# Patient Record
Sex: Male | Born: 1980 | Race: White | Hispanic: No | Marital: Married | Smoking: Never smoker
Health system: Southern US, Community
[De-identification: ages and names within clinical notes are randomized; demographics above are authoritative.]

## PROBLEM LIST (undated history)

## (undated) DIAGNOSIS — R569 Unspecified convulsions: Secondary | ICD-10-CM

## (undated) DIAGNOSIS — F419 Anxiety disorder, unspecified: Secondary | ICD-10-CM

## (undated) DIAGNOSIS — I10 Essential (primary) hypertension: Secondary | ICD-10-CM

## (undated) HISTORY — DX: Unspecified convulsions: R56.9

## (undated) HISTORY — PX: ROTATOR CUFF REPAIR: SHX139

## (undated) HISTORY — DX: Anxiety disorder, unspecified: F41.9

## (undated) HISTORY — DX: Essential (primary) hypertension: I10

## (undated) HISTORY — PX: OTHER SURGICAL HISTORY: SHX169

---

## 2010-02-07 HISTORY — PX: UPPER GI ENDOSCOPY: SHX6162

## 2010-02-07 HISTORY — PX: ESOPHAGOGASTRODUODENOSCOPY: SHX1529

## 2018-03-03 ENCOUNTER — Encounter: Payer: Self-pay | Admitting: Gastroenterology

## 2018-04-09 ENCOUNTER — Encounter: Payer: Self-pay | Admitting: Gastroenterology

## 2018-04-10 ENCOUNTER — Encounter: Payer: Self-pay | Admitting: Gastroenterology

## 2018-04-10 ENCOUNTER — Ambulatory Visit (INDEPENDENT_AMBULATORY_CARE_PROVIDER_SITE_OTHER): Payer: BLUE CROSS/BLUE SHIELD | Admitting: Gastroenterology

## 2018-04-10 VITALS — BP 144/84 | HR 84 | Ht 69.0 in | Wt 226.4 lb

## 2018-04-10 DIAGNOSIS — K449 Diaphragmatic hernia without obstruction or gangrene: Secondary | ICD-10-CM | POA: Diagnosis not present

## 2018-04-10 DIAGNOSIS — R1011 Right upper quadrant pain: Secondary | ICD-10-CM

## 2018-04-10 DIAGNOSIS — K219 Gastro-esophageal reflux disease without esophagitis: Secondary | ICD-10-CM | POA: Diagnosis not present

## 2018-04-10 MED ORDER — DEXLANSOPRAZOLE 60 MG PO CPDR: 60.0000 mg | DELAYED_RELEASE_CAPSULE | Freq: Every day | ORAL | 0 refills | Status: DC

## 2018-04-10 NOTE — Progress Notes (Signed)
Chief Complaint: Abdominal pain, reflux small internal nonbleeding hemorrhoids  Referring Provider:  Dr Sherral Hammers     ASSESSMENT AND PLAN;   #1. RUQ Pain - neg EGD with SB bx 2011, neg [place] 08/2011 #2. GERD with small HH (EGD 01/2010, neg SB bx). #3  IBS with constipation.  Episode of rectal bleeding attributed to hemorrhoids.  No further bleeding.  Plan: - Switch Protonix to Dexilant 60 mg p.o. once a day (2 weeks samples were given)- he did well with Dexilant in the past. - Start Colace 1 tab po qd. - Proceed with ultrasound of the abdomen complete.  If still with RUQ Pain, and work-up is negative, proceed with HIDA with EF to rule out biliary dyskinesia. - If not better in 2 weeks, proceed with EGD and check CBC, CMP, lipase. Pt and patient's wife will call and let us know. - Increase water intake. - Use Preparation H 1 twice daily after the bowel movement for 7 to 10 days.  Patient is to report if he has any further rectal bleeding.  We would like to hold off on colonoscopy at the present time since I do believe that the yield will be low.  However, if patient has any recurrent symptoms, then we would go ahead with colonoscopy.  Patient and patient's wife agreed to hold off on endoscopic procedures at the present time.  - Follow-up in 12 weeks, earlier in case of any problems.    HPI:   37 year old  c/O RUQ pain 1-2 weeks. Sharp, intermittent, occasionally after fatty foods, no nocturnal symptoms, lasting for few minutes to hours.  No radiation. With nausea, no vomiting. Occ exacerbated with eating.  No dysphagia, melena or weight loss. Has been having reflux and acid x yrs, worse over 1 month ago. Had EGD as above which showed small hiatal hernia.  No Barrett's esophagus. Seen by Dr. Sherral Hammers a month ago, he was started on Protonix 40 mg p.o. once a day with some relief. He continues to have problems more at night.  He has raised the head of the bed. No sodas, chocolates, chewing  gums and candy. No ETOH. No NSAIds. Has h/o constipation with occ bloating, passage of pellet-like stools with rare diarrhea. 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  Has not occurred since.   Past Medical History:  Diagnosis Date  . Anxiety   . Hypertension   . Seizures (HCC)     Past Surgical History:  Procedure Laterality Date  . shoulder surgery Left   . UPPER GI ENDOSCOPY  02/07/2010   SMall transient hiatal hernia. Mild gastritis. Bx: Negative for Celiac Disease, negative Clotest    Family History  Problem Relation Age of Onset  . Colon polyps Father   . Diabetes Maternal Grandmother   . Diabetes Maternal Grandfather   . Diabetes Paternal Grandmother   . Diabetes Paternal Grandfather   . Colon polyps Paternal Uncle     Social History   Tobacco Use  . Smoking status: Never Smoker  . Smokeless tobacco: Never Used  Substance Use Topics  . Alcohol use: Not Currently  . Drug use: Never    Current Outpatient Medications  Medication Sig Dispense Refill  . divalproex (DEPAKOTE) 250 MG DR tablet Take 250 mg by mouth daily.    Marland Kitchen lisinopril (PRINIVIL,ZESTRIL) 20 MG tablet Take 20 mg by mouth daily.    . pantoprazole (PROTONIX) 40 MG tablet Take 40 mg by mouth daily.  No current facility-administered medications for this visit.     Not on File  Review of Systems:  Constitutional: Denies fever, chills, diaphoresis, appetite change and fatigue.  HEENT: Denies photophobia, eye pain, redness, hearing loss, ear pain, congestion, sore throat, rhinorrhea, sneezing, mouth sores, neck pain, neck stiffness and tinnitus.   Respiratory: Denies SOB, DOE, cough, chest tightness,  and wheezing.   Cardiovascular: Denies chest pain, palpitations and leg swelling.  Genitourinary: Denies dysuria, urgency, frequency, hematuria, flank pain and difficulty urinating.  Musculoskeletal: Denies myalgias, back pain, joint swelling, arthralgias and gait problem.   Skin: No rash.  Neurological: Denies dizziness,  Had seizures, no syncope, weakness, light-headedness, numbness and headaches.  Hematological: Denies adenopathy. Easy bruising, personal or family bleeding history  Psychiatric/Behavioral: No anxiety or depression     Physical Exam:    BP (!) 144/84   Pulse 84   Ht  (1.753 m)   Wt 226 lb 6 oz (102.7 kg)   BMI 33.43 kg/m  Filed Weights   04/10/18 0837  Weight: 226 lb 6 oz (102.7 kg)   Constitutional:  Well-developed, in no acute distress. Psychiatric: Normal mood and affect. Behavior is normal. HEENT: Pupils normal.  Conjunctivae are normal. No scleral icterus. Neck supple.  Cardiovascular: Normal rate, regular rhythm. No edema Pulmonary/chest: Effort normal and breath sounds normal. No wheezing, rales or rhonchi. Abdominal: Soft, nondistended. Nontender. Bowel sounds active throughout. There are no masses palpable. No hepatomegaly. Rectal: Small, nonbleeding internal hemorrhoids, heme-negative stools, performed in presence of [facility] Neurological: Alert and oriented to person place and time. Skin: Skin is warm and dry. No rashes noted.   Edman Circle, MD   Cc: Dr. Sherral Hammers

## 2018-04-10 NOTE — Patient Instructions (Signed)
If you are age 38 or older, your body mass index should be between 23-30. Your Body mass index is 33.43 kg/m. If this is out of the aforementioned range listed, please consider follow up with your Primary Care Provider.  If you are age 10 or younger, your body mass index should be between 19-25. Your Body mass index is 33.43 kg/m. If this is out of the aformentioned range listed, please consider follow up with your Primary Care Provider.   Please purchase the following medications over the counter and take as directed: Colace one by mouth daily.  We have given you samples of the following medication to take: Dexilant 60 mg once daily  You have been scheduled for an abdominal ultrasound at [facility] (1st floor [address] ) on 04/13/18 at 8am . Please arrive 15 minutes prior to your appointment for registration. Make certain not to have anything to eat or drink 6 hours prior to your appointment. Should you need to reschedule your appointment, please contact radiology at [phone]. This test typically takes about 30 minutes to perform.  Please call Dr. Donne Hazel nurse Christen Bame, RN)  in 2 weeks at [phone]  to let her now how you are doing.    Thank you,  Dr. Lynann Bologna

## 2018-04-13 ENCOUNTER — Telehealth: Payer: Self-pay | Admitting: Gastroenterology

## 2018-04-13 ENCOUNTER — Ambulatory Visit (HOSPITAL_BASED_OUTPATIENT_CLINIC_OR_DEPARTMENT_OTHER)
Admission: RE | Admit: 2018-04-13 | Discharge: 2018-04-13 | Disposition: A | Discharge status: Home / Self Care | Payer: BLUE CROSS/BLUE SHIELD | Source: Ambulatory Visit | Attending: Gastroenterology | Admitting: Gastroenterology

## 2018-04-13 ENCOUNTER — Telehealth: Payer: Self-pay

## 2018-04-13 DIAGNOSIS — R1011 Right upper quadrant pain: Secondary | ICD-10-CM

## 2018-04-13 DIAGNOSIS — K449 Diaphragmatic hernia without obstruction or gangrene: Secondary | ICD-10-CM | POA: Diagnosis not present

## 2018-04-13 DIAGNOSIS — K219 Gastro-esophageal reflux disease without esophagitis: Secondary | ICD-10-CM | POA: Diagnosis not present

## 2018-04-13 DIAGNOSIS — K824 Cholesterolosis of gallbladder: Secondary | ICD-10-CM | POA: Insufficient documentation

## 2018-04-13 NOTE — Telephone Encounter (Signed)
I did send a copy of the ultrasound to his PCP.

## 2018-04-13 NOTE — Telephone Encounter (Signed)
-----   Message from Lynann Bologna, MD sent at 04/13/2018 10:42 AM EDT ----- Neg [place] Mild fatty liver, small gallbladder polyps Please inform patient about the results. Please see comment/plan as per last clinic note. Please send a copy to family physician.

## 2018-04-13 NOTE — Telephone Encounter (Signed)
Notified of test result by phone.

## 2018-04-13 NOTE — Telephone Encounter (Signed)
He called back to clarify the polyp was in his gallbladder, it is.

## 2018-04-23 ENCOUNTER — Telehealth: Payer: Self-pay | Admitting: Gastroenterology

## 2018-04-23 NOTE — Telephone Encounter (Signed)
Returned his call, voicemail.  Lmtcb

## 2018-04-24 ENCOUNTER — Telehealth: Payer: Self-pay

## 2018-04-24 ENCOUNTER — Other Ambulatory Visit: Payer: Self-pay

## 2018-04-24 DIAGNOSIS — K219 Gastro-esophageal reflux disease without esophagitis: Secondary | ICD-10-CM

## 2018-04-24 MED ORDER — DEXLANSOPRAZOLE 60 MG PO CPDR: 60.0000 mg | DELAYED_RELEASE_CAPSULE | Freq: Every day | ORAL | 1 refills | Status: DC

## 2018-04-24 NOTE — Telephone Encounter (Signed)
He called back to report that his symptoms are much improved with the Dexilant and asked for a full prescription.  I sent #90 with 1 refill to his pharmacy.

## 2018-04-24 NOTE — Telephone Encounter (Signed)
Patient states [facility] has not yet received prescription and is asking we call them again.

## 2018-04-24 NOTE — Telephone Encounter (Signed)
I did physically call the prescription to the pharmacy and the patient is aware.

## 2018-04-27 NOTE — Telephone Encounter (Signed)
Please see if he has received a prescription

## 2018-04-28 ENCOUNTER — Telehealth: Payer: Self-pay

## 2018-04-28 NOTE — Telephone Encounter (Signed)
I called the rx to the pharmacy on Friday, patient is aware.

## 2018-04-30 ENCOUNTER — Encounter: Payer: Self-pay | Admitting: Gastroenterology

## 2018-05-01 ENCOUNTER — Encounter: Payer: Self-pay | Admitting: Gastroenterology

## 2018-05-01 ENCOUNTER — Ambulatory Visit (INDEPENDENT_AMBULATORY_CARE_PROVIDER_SITE_OTHER): Payer: BLUE CROSS/BLUE SHIELD | Admitting: Gastroenterology

## 2018-05-01 VITALS — BP 120/76 | HR 78 | Ht 69.0 in | Wt 227.1 lb

## 2018-05-01 DIAGNOSIS — R1011 Right upper quadrant pain: Secondary | ICD-10-CM

## 2018-05-01 NOTE — Progress Notes (Signed)
IMPRESSION and PLAN:   #1. RUQ Pain (resolved) - neg [place] except for small GB polyp, neg EGD with SB bx 2011, neg [place] 08/2011 #2. GERD with small HH (EGD 01/2010, neg SB bx). #3  IBS with constipation.  No further bleeding.  Plan: - Continue Dexilant 60 mg p.o. once a day (2 weeks samples were given)- he did well with Dexilant in the past. - Proceed with HIDA with EF to rule out biliary dyskinesia. - If not better in 2 weeks, proceed with EGD and check CBC, CMP, lipase. Pt and patient's wife will call and let us know. - Increase water intake. - Encouraged to lose weight (6lb over 3 months) - Follow-up in case of any problems.      HPI:    Chief Complaint:   Mike Miranda is a 37 y.o. male  Doing very well on Dexilant samples -having problems getting insurance approve Dexilant. Ultrasound was negative for any gallstones.  It did show small gallbladder polyp and fatty liver. Minimal right sided abdominal pain. No nausea, vomiting, heartburn, regurgitation, odynophagia or dysphagia.  No further rectal bleeding.  He would like to hold off on any endoscopic procedures but willing to get a HIDA scan.   Past Medical History:  Diagnosis Date  . Anxiety   . Hypertension   . Seizures (HCC)     Current Outpatient Medications  Medication Sig Dispense Refill  . dexlansoprazole (DEXILANT) 60 MG capsule Take 1 capsule (60 mg total) by mouth daily. 90 capsule 1  . divalproex (DEPAKOTE) 250 MG DR tablet Take 250 mg by mouth daily.    Marland Kitchen lisinopril (PRINIVIL,ZESTRIL) 20 MG tablet Take 20 mg by mouth daily.    . pantoprazole (PROTONIX) 40 MG tablet Take 40 mg by mouth daily.     No current facility-administered medications for this visit.     Past Surgical History:  Procedure Laterality Date  . ESOPHAGOGASTRODUODENOSCOPY  02/07/2010   Small transient hiatal hernia  . shoulder surgery Left   . UPPER GI ENDOSCOPY  02/07/2010   SMall transient hiatal hernia. Mild gastritis. Bx: Negative  for Celiac Disease, negative Clotest    Family History  Problem Relation Age of Onset  . Colon polyps Father   . Diabetes Maternal Grandmother   . Diabetes Maternal Grandfather   . Diabetes Paternal Grandmother   . Diabetes Paternal Grandfather   . Colon polyps Paternal Uncle     Social History   Tobacco Use  . Smoking status: Never Smoker  . Smokeless tobacco: Never Used  Substance Use Topics  . Alcohol use: Not Currently  . Drug use: Never    Not on File   Review of Systems: All systems reviewed and negative except where noted in HPI.    Physical Exam:     BP 120/76   Pulse 78   Ht  (1.753 m)   Wt 227 lb 2 oz (103 kg)   BMI 33.54 kg/m  @ GENERAL:  Alert, oriented, cooperative, not in acute distress. PSYCH: :Pleasant, normal mood and affect. ABDOMEN: Inspection: No visible peristalsis, no abnormal pulsations, skin normal.  Palpation/percussion: Soft, nontender, nondistended, no rigidity, no abnormal dullness to percussion, no palpable abdominal masses.  Auscultation: Normal bowel sounds, no abdominal bruits. NEURO: Alert and oriented x 3,   Radiology Studies: US Abdomen Complete  Result Date: 04/13/2018 CLINICAL DATA:  Right upper quadrant abdominal pain. EXAM: ABDOMEN ULTRASOUND COMPLETE COMPARISON:  None. FINDINGS: Gallbladder: No gallstones or  wall thickening visualized. No sonographic Murphy sign noted by sonographer. 2 by 3 mm polyp in the gallbladder on image 61. Common bile duct: Diameter: 4 mm Liver: No focal lesion identified. Mildly accentuated echogenicity could reflect mild hepatic steatosis. Portal vein is patent on color Doppler imaging with normal direction of blood flow towards the liver. IVC: No abnormality visualized. Pancreas: Visualized portion unremarkable. Pancreatic tail and portions of the pancreatic head not well seen due to overlying bowel gas. Spleen: Size and appearance within normal limits. Right Kidney: Length: 11.9 cm.  Echogenicity within normal limits. No mass or hydronephrosis visualized. Left Kidney: Length: 11.9 cm. Echogenicity within normal limits. No mass or hydronephrosis visualized. Abdominal aorta: No aneurysm visualized. Other findings: None. IMPRESSION: 1. 2 by 3 mm gallbladder polyp. Gallbladder polyps in this size range are considered benign and do not require follow up. 2. Suspected mild hepatic steatosis. 3. A cause for the patient's right upper quadrant abdominal pain is not identified. Electronically Signed   By: Gaylyn Rong M.D.   On: 04/13/2018 08:57      Calel Pisarski,MD 05/01/2018, 8:44 AM   CC Hadley Pen, MD

## 2018-05-01 NOTE — Patient Instructions (Signed)
If you are age 39 or older, your body mass index should be between 23-30. Your Body mass index is 33.54 kg/m. If this is out of the aforementioned range listed, please consider follow up with your Primary Care Provider.  If you are age 21 or younger, your body mass index should be between 19-25. Your Body mass index is 33.54 kg/m. If this is out of the aformentioned range listed, please consider follow up with your Primary Care Provider.    We have given you samples of the following medication to take: Dexilant 60 mg take one capsule daily x 4 weeks and then as needed.   You have been scheduled for a HIDA scan at [facility] (1st floor) on 05/08/18. Please arrive 15 minutes prior to your scheduled appointment at 1:61WR. Make certain not to have anything to eat or drink at least 6 hours prior to your test. Should this appointment date or time not work well for you, please call radiology scheduling at [phone].  _____________________________________________________________________ hepatobiliary (HIDA) scan is an imaging procedure used to diagnose problems in the liver, gallbladder and bile ducts. In the HIDA scan, a radioactive chemical or tracer is injected into a vein in your arm. The tracer is handled by the liver like bile. Bile is a fluid produced and excreted by your liver that helps your digestive system break down fats in the foods you eat. Bile is stored in your gallbladder and the gallbladder releases the bile when you eat a meal. A special nuclear medicine scanner (gamma camera) tracks the flow of the tracer from your liver into your gallbladder and small intestine.  During your HIDA scan  You'll be asked to change into a hospital gown before your HIDA scan begins. Your health care team will position you on a table, usually on your back. The radioactive tracer is then injected into a vein in your arm.The tracer travels through your bloodstream to your liver, where it's taken up by  the bile-producing cells. The radioactive tracer travels with the bile from your liver into your gallbladder and through your bile ducts to your small intestine.You may feel some pressure while the radioactive tracer is injected into your vein. As you lie on the table, a special gamma camera is positioned over your abdomen taking pictures of the tracer as it moves through your body. The gamma camera takes pictures continually for about an hour. You'll need to keep still during the HIDA scan. This can become uncomfortable, but you may find that you can lessen the discomfort by taking deep breaths and thinking about other things. Tell your health care team if you're uncomfortable. The radiologist will watch on a computer the progress of the radioactive tracer through your body. The HIDA scan may be stopped when the radioactive tracer is seen in the gallbladder and enters your small intestine. This typically takes about an hour. In some cases extra imaging will be performed if original images aren't satisfactory, if morphine is given to help visualize the gallbladder or if the medication CCK is given to look at the contraction of the gallbladder. This test typically takes 2 hours to complete. ________________________________________________________________________   Thank you,  Dr. Lynann Bologna

## 2018-05-08 ENCOUNTER — Ambulatory Visit (HOSPITAL_COMMUNITY)
Admission: RE | Admit: 2018-05-08 | Discharge: 2018-05-08 | Disposition: A | Discharge status: Home / Self Care | Payer: BLUE CROSS/BLUE SHIELD | Source: Ambulatory Visit | Attending: Gastroenterology | Admitting: Gastroenterology

## 2018-05-08 DIAGNOSIS — R1011 Right upper quadrant pain: Secondary | ICD-10-CM

## 2018-05-08 MED ORDER — TECHNETIUM TC 99M MEBROFENIN IV KIT
5.0000 | PACK | Freq: Once | INTRAVENOUS | Status: AC | PRN
  Administered 2018-05-08: 5 via INTRAVENOUS

## 2018-05-12 ENCOUNTER — Telehealth: Payer: Self-pay | Admitting: Gastroenterology

## 2018-05-12 NOTE — Telephone Encounter (Signed)
I spoke with the patient and gave him the results of the HIDA scan.  He reports that the Dexilant is working but has noticed that there are definitely some foods that trigger his pain.  Suggested he keep a food diary.  He should try to avoid those foods.  He will report back in a week or so to let us know how he is doing.

## 2018-05-12 NOTE — Telephone Encounter (Signed)
Patient states he is returning nurses call about test results from 6.7.19.

## 2018-11-06 ENCOUNTER — Other Ambulatory Visit: Payer: Self-pay | Admitting: Gastroenterology

## 2018-11-06 DIAGNOSIS — K219 Gastro-esophageal reflux disease without esophagitis: Secondary | ICD-10-CM

## 2019-06-29 IMAGING — US US ABDOMEN COMPLETE
1 series · 13 of 25 positions shown · non-contrast
Comparison: None.

CLINICAL DATA: Right upper quadrant abdominal pain.

EXAM:
ABDOMEN ULTRASOUND COMPLETE

[Series 1: us abdomen complete · 0.28mm/px · 13 of 106 slices shown]
[im 1/106]
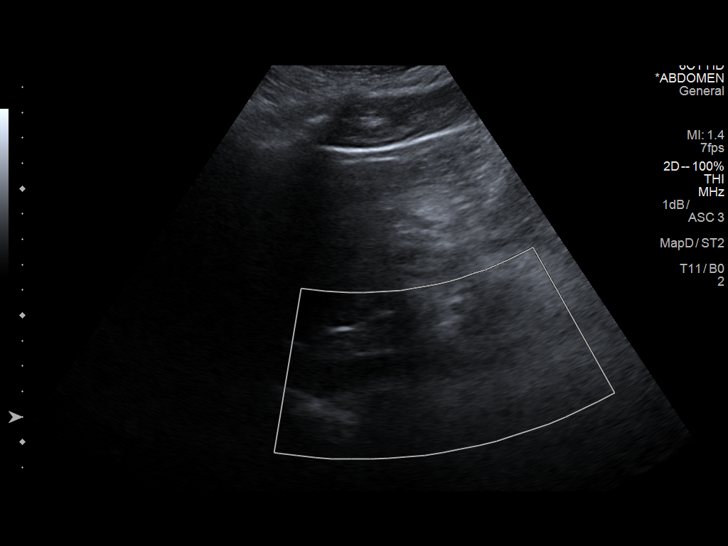
[im 9/106]
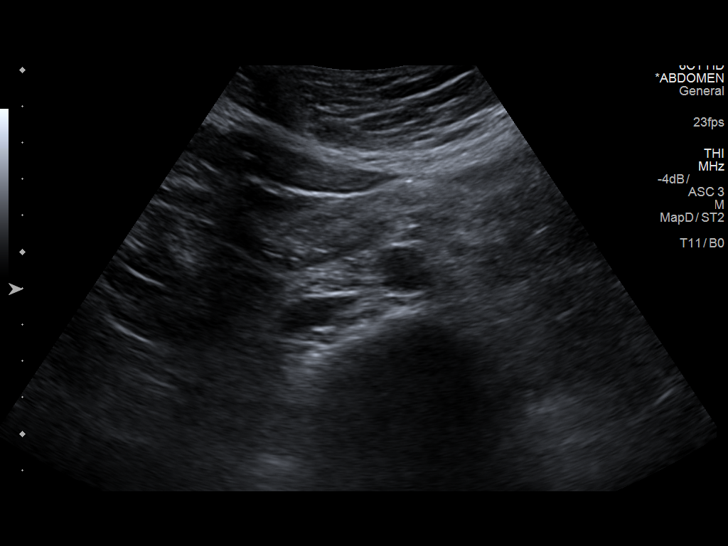
[im 18/106]
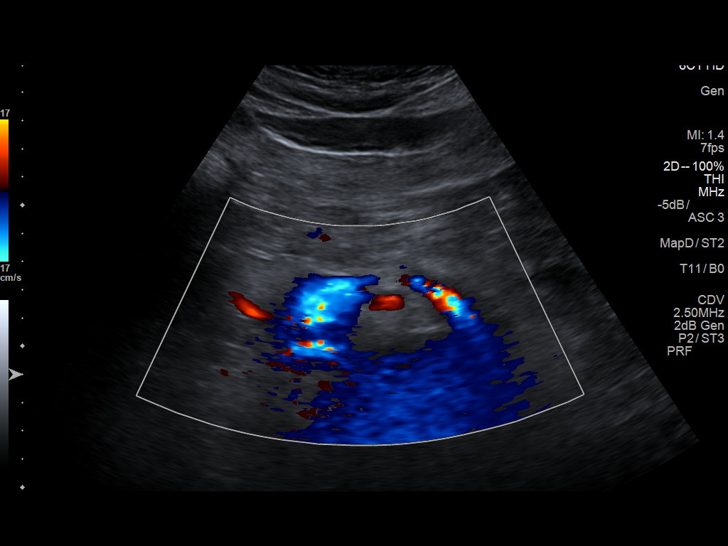
[im 27/106]
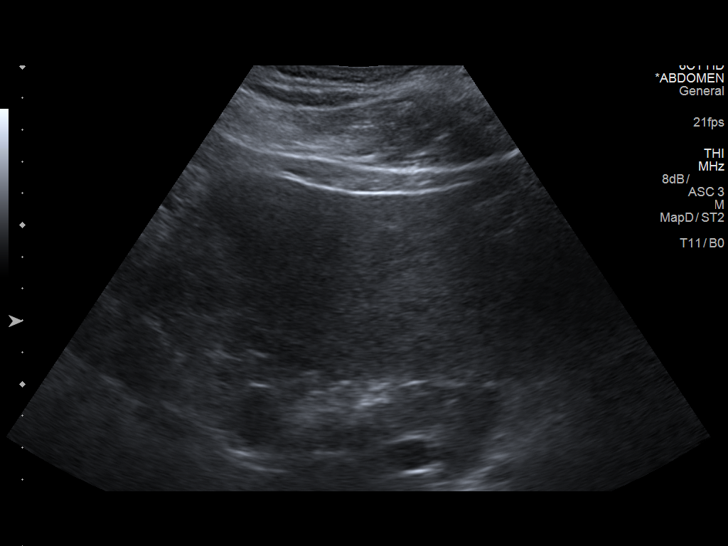
[im 36/106]
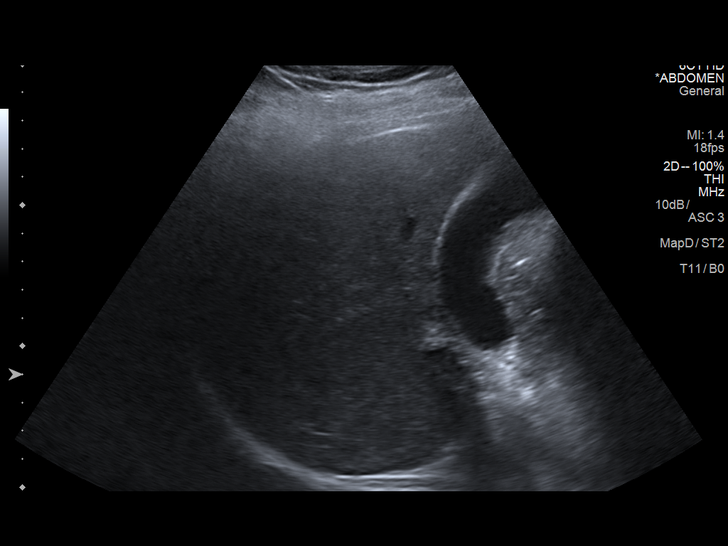
[im 44/106]
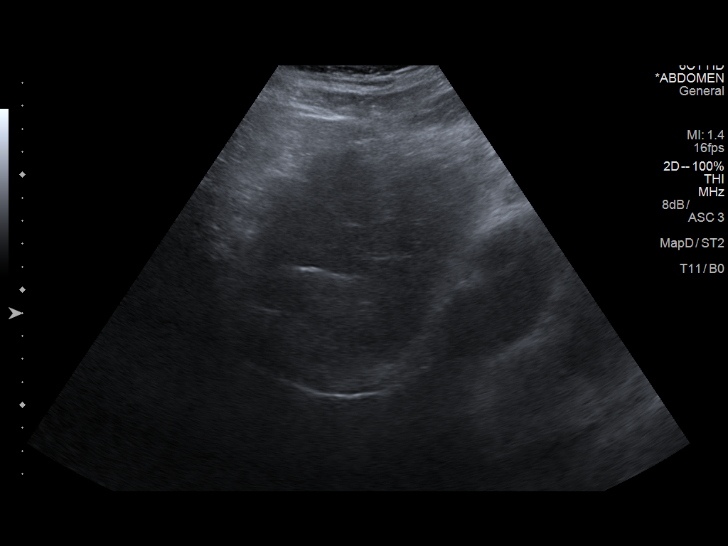
[im 53/106]
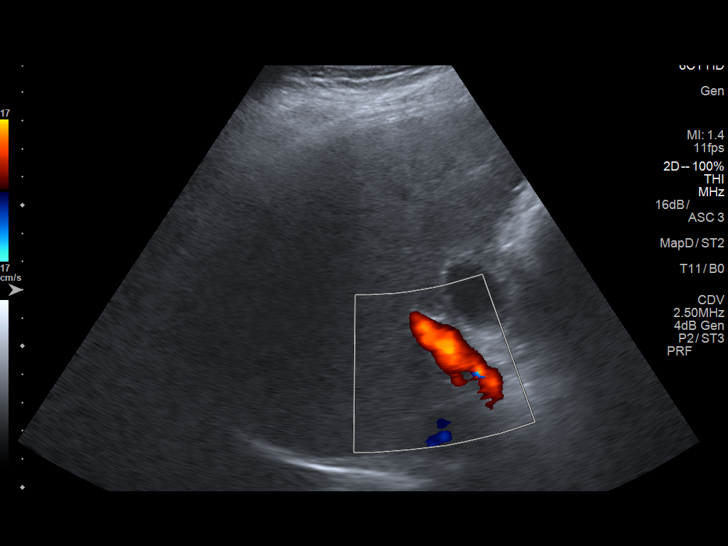
[im 62/106]
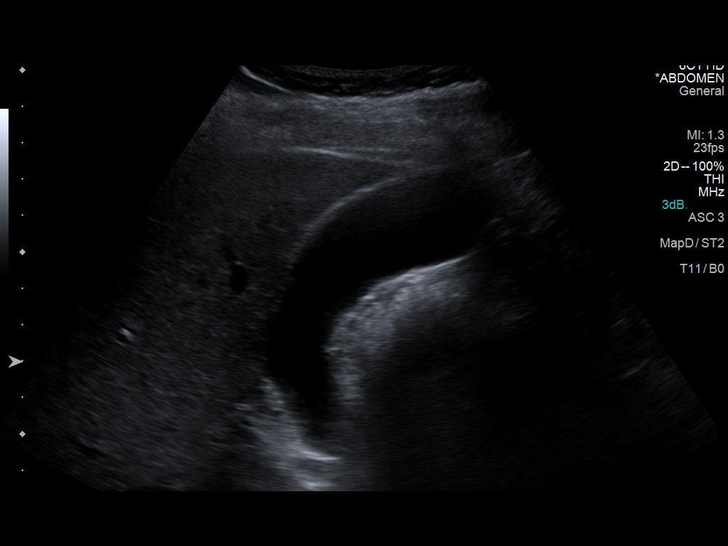
[im 71/106]
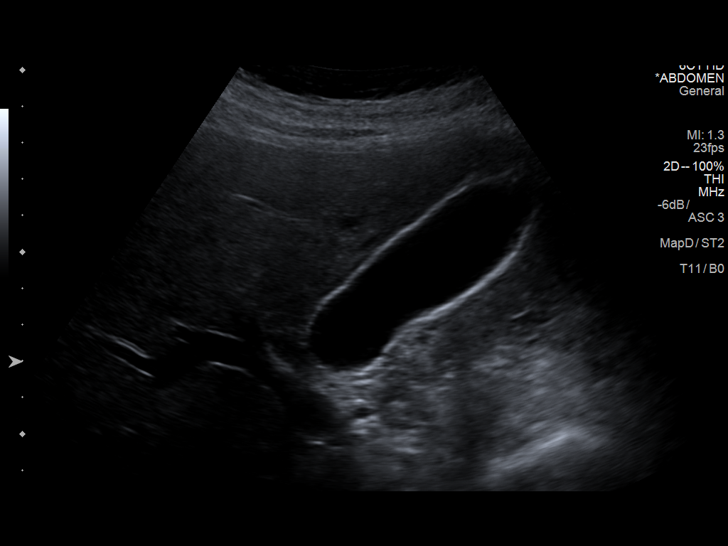
[im 79/106]
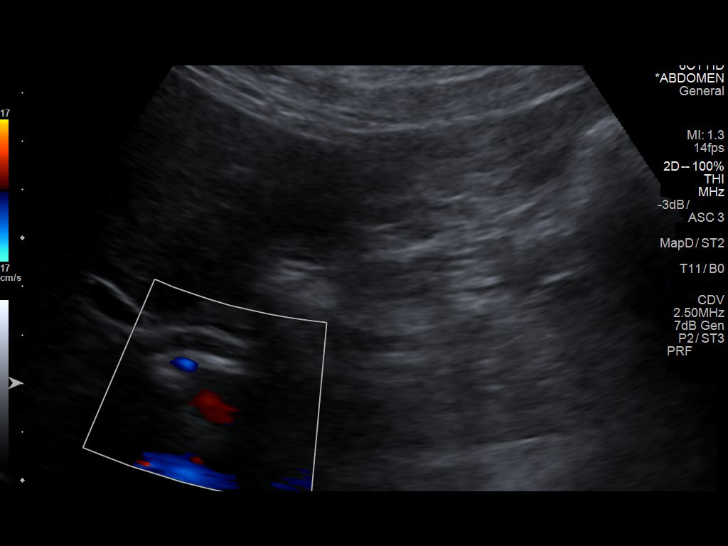
[im 88/106]
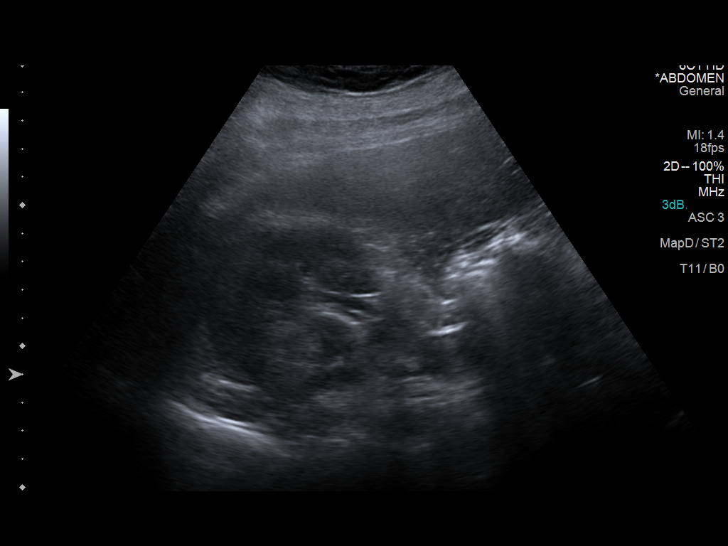
[im 97/106]
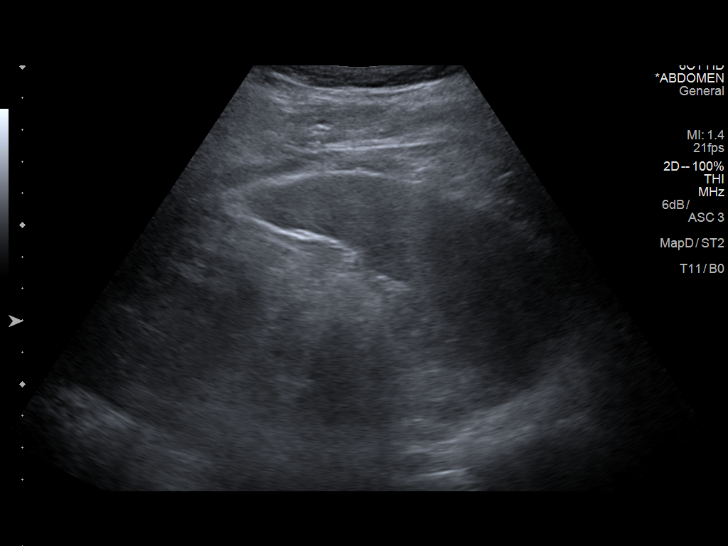
[im 106/106]
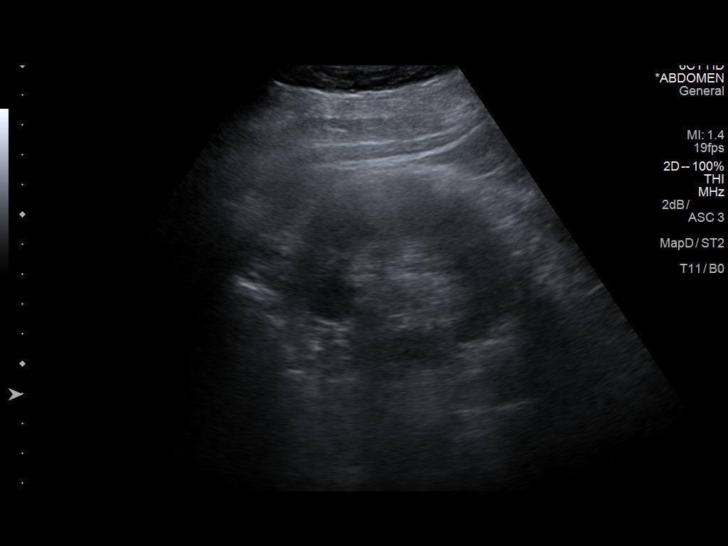

[13 of 25 positions shown; findings below may reference images not displayed]

FINDINGS: Gallbladder: No gallstones or wall thickening visualized. No
sonographic Murphy sign noted by sonographer. 2 by 3 mm polyp in the
gallbladder on image 61.

Common bile duct: Diameter: 4 mm

Liver: No focal lesion identified. Mildly accentuated echogenicity
could reflect mild hepatic steatosis. Portal vein is patent on color
Doppler imaging with normal direction of blood flow towards the
liver.

IVC: No abnormality visualized.

Pancreas: Visualized portion unremarkable. Pancreatic tail and
portions of the pancreatic head not well seen due to overlying bowel
gas.

Spleen: Size and appearance within normal limits.

Right Kidney: Length: 11.9 cm. Echogenicity within normal limits. No
mass or hydronephrosis visualized.

Left Kidney: Length: 11.9 cm. Echogenicity within normal limits. No
mass or hydronephrosis visualized.

Abdominal aorta: No aneurysm visualized.

Other findings: None.
IMPRESSION: 1. 2 by 3 mm gallbladder polyp. Gallbladder polyps in this size
range are considered benign and do not require follow up.
2. Suspected mild hepatic steatosis.
3. A cause for the patient's right upper quadrant abdominal pain is
not identified.

## 2019-08-03 ENCOUNTER — Encounter: Payer: Self-pay | Admitting: Gastroenterology

## 2019-08-03 ENCOUNTER — Other Ambulatory Visit: Payer: Self-pay

## 2019-08-03 ENCOUNTER — Ambulatory Visit (INDEPENDENT_AMBULATORY_CARE_PROVIDER_SITE_OTHER): Payer: BC Managed Care – PPO | Admitting: Gastroenterology

## 2019-08-03 VITALS — BP 144/94 | HR 93 | Temp 98.3°F | Ht 69.0 in | Wt 235.4 lb

## 2019-08-03 DIAGNOSIS — R1011 Right upper quadrant pain: Secondary | ICD-10-CM

## 2019-08-03 DIAGNOSIS — K219 Gastro-esophageal reflux disease without esophagitis: Secondary | ICD-10-CM | POA: Diagnosis not present

## 2019-08-03 DIAGNOSIS — K581 Irritable bowel syndrome with constipation: Secondary | ICD-10-CM

## 2019-08-03 MED ORDER — DICYCLOMINE HCL 10 MG PO CAPS: ORAL_CAPSULE | ORAL | 3 refills | Status: DC

## 2019-08-03 MED ORDER — DEXILANT 60 MG PO CPDR: 1.0000 | DELAYED_RELEASE_CAPSULE | Freq: Every day | ORAL | 11 refills | Status: AC

## 2019-08-03 NOTE — Patient Instructions (Signed)
If you are age 39 or older, your body mass index should be between 23-30. Your Body mass index is 34.76 kg/m. If this is out of the aforementioned range listed, please consider follow up with your Primary Care Provider.  If you are age 29 or younger, your body mass index should be between 19-25. Your Body mass index is 34.76 kg/m. If this is out of the aformentioned range listed, please consider follow up with your Primary Care Provider.   We have sent the following medications to your pharmacy for you to pick up at your convenience: [place] at bedtime.   Increase water intake.   Try to lose 6 lbs over the next 3 months.   You have been scheduled for an endoscopy. Please follow written instructions given to you at your visit today. If you use inhalers (even only as needed), please bring them with you on the day of your procedure. Your physician has requested that you go to www.startemmi.com and enter the access code given to you at your visit today. This web site gives a general overview about your procedure. However, you should still follow specific instructions given to you by our office regarding your preparation for the procedure.   Thank you,  Dr. Jackquline Denmark

## 2019-08-03 NOTE — Progress Notes (Signed)
IMPRESSION and PLAN:   #1. RUQ Pain (resolved) - neg US except for unchanged small GB polyp 08/2011, 04/2018, neg EGD with SB bx 2011, neg HIDA with EF 05/2018. #2. GERD with small HH (EGD 01/2010, neg SB bx). Failed omeprazole. #3  IBS with constipation, now with occ diarrhea.  No further bleeding.  Plan: - Continue Dexilant 60 mg p.o. once a day #30, 11 refills. Dexilant coupons please - Continue pepcid 20mg  po qhs. - Proceed with EGD.  Discussed risks and benefits. - Increase water intake. - Bentyl 10mg  po bid 1/2hr before lunch and bed time (#60). 11 refills - Encouraged to lose weight (6lb over 3 months) - Follow-up in case of any problems. If still with problems, will perform further work-up.  At the present time would like to hold off on colonoscopy since I do believe the yield will be low.  He agrees.      HPI:    Chief Complaint:   Marguerite OleaJason Miranda is a 38 y.o. male  FU visit Having upper abdo pain, mainly right upper quadrant With heartburn No dysphagia But has regurgitation Doing very well on Dexilant samples -having problems getting insurance approve Dexilant. Ultrasound was negative for any gallstones.  It did show small gallbladder polyp and fatty liver. HIDA with EF was neg. Minimal right sided abdominal pain. Has occ diarrhea now after eating out.  More so recently.  No nocturnal symptoms. No nausea, vomiting, heartburn, regurgitation, odynophagia or dysphagia.  No further rectal bleeding.  Gained 6 pounds over the last 3 months.  Review of labs: 01/29/2019 normal CMP  Past Medical History:  Diagnosis Date  . Anxiety   . Hypertension   . Seizures (HCC)     Current Outpatient Medications  Medication Sig Dispense Refill  . DEXILANT 60 MG capsule TAKE ONE CAPSULE BY MOUTH DAILY 90 capsule 1  . divalproex (DEPAKOTE) 250 MG DR tablet Take 250 mg by mouth daily.    . famotidine (PEPCID) 40 MG tablet Take 40 mg by mouth at bedtime.    Marland Kitchen. lisinopril  (PRINIVIL,ZESTRIL) 20 MG tablet Take 20 mg by mouth daily.     No current facility-administered medications for this visit.     Past Surgical History:  Procedure Laterality Date  . ESOPHAGOGASTRODUODENOSCOPY  02/07/2010   Small transient hiatal hernia  . shoulder surgery Left   . UPPER GI ENDOSCOPY  02/07/2010   SMall transient hiatal hernia. Mild gastritis. Bx: Negative for Celiac Disease, negative Clotest    Family History  Problem Relation Age of Onset  . Colon polyps Father   . Diabetes Maternal Grandmother   . Diabetes Maternal Grandfather   . Diabetes Paternal Grandmother   . Diabetes Paternal Grandfather   . Colon polyps Paternal Uncle   . Colon cancer Neg Hx   . Esophageal cancer Neg Hx     Social History   Tobacco Use  . Smoking status: Never Smoker  . Smokeless tobacco: Never Used  Substance Use Topics  . Alcohol use: Not Currently  . Drug use: Never    Not on File   Review of Systems: All systems reviewed and negative except where noted in HPI.    Physical Exam:     BP (!) 144/94   Pulse 93   Temp 98.3 F (36.8 C)   Ht 5\' 9"  (1.753 m)   Wt 235 lb 6 oz (106.8 kg)   BMI 34.76 kg/m  @WEIGHTLAST3 @ GENERAL:  Alert, oriented,  cooperative, not in acute distress. PSYCH: :Pleasant, normal mood and affect. ABDOMEN: Inspection: No visible peristalsis, no abnormal pulsations, skin normal.  Palpation/percussion: Soft, nontender, nondistended, no rigidity, no abnormal dullness to percussion, no palpable abdominal masses.  Auscultation: Normal bowel sounds, no abdominal bruits. NEURO: Alert and oriented x 3,   Radiology Studies: No results found.    Mike Brodrick,MD 08/03/2019, 2:19 PM   CC Myrlene Broker, MD

## 2019-08-16 ENCOUNTER — Telehealth: Payer: Self-pay | Admitting: Gastroenterology

## 2019-08-16 NOTE — Telephone Encounter (Signed)
Do you now or have you had a fever in the last 14 days?     ° °  ° °Do you have any respiratory symptoms of shortness of breath or cough now or in the last 14 days?    ° °  ° °Do you have any family members or close contacts with diagnosed or suspected Covid-19 in the past 14 days?     ° °  ° °Have you been tested for Covid-19 and found to be positive?    ° °  ° °Pt made aware to check in on theth floor and that care partner may wait in the car or come up to the lobby during the procedure but will need to provide their own mask. °

## 2019-08-17 ENCOUNTER — Ambulatory Visit (AMBULATORY_SURGERY_CENTER): Payer: BC Managed Care – PPO | Admitting: Gastroenterology

## 2019-08-17 ENCOUNTER — Other Ambulatory Visit: Payer: Self-pay

## 2019-08-17 ENCOUNTER — Encounter: Payer: Self-pay | Admitting: Gastroenterology

## 2019-08-17 VITALS — BP 115/74 | HR 72 | Temp 97.6°F | Resp 13 | Ht 69.0 in | Wt 235.0 lb

## 2019-08-17 DIAGNOSIS — K219 Gastro-esophageal reflux disease without esophagitis: Secondary | ICD-10-CM | POA: Diagnosis present

## 2019-08-17 DIAGNOSIS — K2970 Gastritis, unspecified, without bleeding: Secondary | ICD-10-CM

## 2019-08-17 DIAGNOSIS — K449 Diaphragmatic hernia without obstruction or gangrene: Secondary | ICD-10-CM

## 2019-08-17 MED ORDER — SODIUM CHLORIDE 0.9 % IV SOLN: 500.0000 mL | Freq: Once | INTRAVENOUS | Status: AC

## 2019-08-17 NOTE — Op Note (Signed)
[facility] Patient Name: Marguerite OleaJason Wille Procedure Date: 08/17/2019 10:22 AM MRN: [id number] Endoscopist: Lynann Bolognaajesh Brigitta Pricer , MD Age: 38 Referring MD:  Date of Birth: 08/22/1981 Gender: Male Account #: [id number] Procedure:                Upper GI endoscopy Indications:              RUQ abdominal pain, GERD Medicines:                Monitored Anesthesia Care Procedure:                Pre-Anesthesia Assessment:                           - Prior to the procedure, a History and Physical                            was performed, and patient medications and                            allergies were reviewed. The patient's tolerance of                            previous anesthesia was also reviewed. The risks                            and benefits of the procedure and the sedation                            options and risks were discussed with the patient.                            All questions were answered, and informed consent                            was obtained. Prior Anticoagulants: The patient has                            taken no previous anticoagulant or antiplatelet                            agents. ASA Grade Assessment: II - A patient with                            mild systemic disease. After reviewing the risks                            and benefits, the patient was deemed in                            satisfactory condition to undergo the procedure.                           After obtaining informed consent, the endoscope was  passed under direct vision. Throughout the                            procedure, the patient's blood pressure, pulse, and                            oxygen saturations were monitored continuously. The                            Endoscope was introduced through the mouth, and                            advanced to the second part of duodenum. The upper                            GI endoscopy was accomplished  without difficulty.                            The patient tolerated the procedure well. Scope In: Scope Out: Findings:                 The esophagus was normal. Z line was well-defined                            at 35 cm. A 3 cm hiatal hernia was present                            confirmed on retroflexed examination.                           Localized mild inflammation characterized by                            erythema was found in the gastric antrum. Biopsies                            were taken with a cold forceps for histology.                            Estimated blood loss: none.                           The in the duodenum was normal. Complications:            No immediate complications. Estimated Blood Loss:     Estimated blood loss: none. Impression:               - 3 cm hiatal hernia.                           - Minimal gastritis. Recommendation:           - Patient has a contact number available for                            emergencies. The signs and symptoms of potential  delayed complications were discussed with the                            patient. Return to normal activities tomorrow.                            Written discharge instructions were provided to the                            patient.                           - Resume previous diet.                           - Continue present medications.                           - Follow an antireflux regimen indefinitely.                           - FU in 12 weeks. Lynann Bologna, MD 08/17/2019 10:40:09 AM This report has been signed electronically.

## 2019-08-17 NOTE — Progress Notes (Signed)
Called to room to assist during endoscopic procedure.  Patient ID and intended procedure confirmed with present staff. Received instructions for my participation in the procedure from the performing physician.  

## 2019-08-17 NOTE — Patient Instructions (Signed)
Impression/Recommendations:  Hiatal hernia handout given to patient. Gastritis handout given to patient. Antireflux regimen handout given to patient.  Resume previous diet. Continue present medications.  Follow antireflux regimen indefinitely.  Follow-up in my office in 12 weeks.  YOU HAD AN ENDOSCOPIC PROCEDURE TODAY AT [facility]:   Refer to the procedure report that was given to you for any specific questions about what was found during the examination.  If the procedure report does not answer your questions, please call your gastroenterologist to clarify.  If you requested that your care partner not be given the details of your procedure findings, then the procedure report has been included in a sealed envelope for you to review at your convenience later.  YOU SHOULD EXPECT: Some feelings of bloating in the abdomen. Passage of more gas than usual.  Walking can help get rid of the air that was put into your GI tract during the procedure and reduce the bloating. If you had a lower endoscopy (such as a colonoscopy or flexible sigmoidoscopy) you may notice spotting of blood in your stool or on the toilet paper. If you underwent a bowel prep for your procedure, you may not have a normal bowel movement for a few days.  Please Note:  You might notice some irritation and congestion in your nose or some drainage.  This is from the oxygen used during your procedure.  There is no need for concern and it should clear up in a day or so.  SYMPTOMS TO REPORT IMMEDIATELY:  Following upper endoscopy (EGD)  Vomiting of blood or coffee ground material  New chest pain or pain under the shoulder blades  Painful or persistently difficult swallowing  New shortness of breath  Fever of 100F or higher  Black, tarry-looking stools  For urgent or emergent issues, a gastroenterologist can be reached at any hour by calling [phone].   DIET:  We do recommend a small meal at first, but  then you may proceed to your regular diet.  Drink plenty of fluids but you should avoid alcoholic beverages for 24 hours.  ACTIVITY:  You should plan to take it easy for the rest of today and you should NOT DRIVE or use heavy machinery until tomorrow (because of the sedation medicines used during the test).    FOLLOW UP: Our staff will call the number listed on your records 48-72 hours following your procedure to check on you and address any questions or concerns that you may have regarding the information given to you following your procedure. If we do not reach you, we will leave a message.  We will attempt to reach you two times.  During this call, we will ask if you have developed any symptoms of COVID 19. If you develop any symptoms (ie: fever, flu-like symptoms, shortness of breath, cough etc.) before then, please call [phone].  If you test positive for Covid 19 in the 2 weeks post procedure, please call and report this information to [place].    If any biopsies were taken you will be contacted by phone or by letter within the next 1-3 weeks.  Please call us at [phone] if you have not heard about the biopsies in 3 weeks.    SIGNATURES/CONFIDENTIALITY: You and/or your care partner have signed paperwork which will be entered into your electronic medical record.  These signatures attest to the fact that that the information above on your After Visit Summary has been reviewed and is understood.  Full responsibility of the confidentiality of this discharge information lies with you and/or your care-partner.

## 2019-08-17 NOTE — Progress Notes (Signed)
Temp check by AM/vital check by CW.  History reviewed and no changes necessary.

## 2019-08-17 NOTE — Progress Notes (Signed)
PT taken to PACU. Monitors in place. VSS. Report given to RN. 

## 2019-08-19 ENCOUNTER — Telehealth: Payer: Self-pay

## 2019-08-19 NOTE — Telephone Encounter (Signed)
Covid-19 screening questions   Do you now or have you had a fever in the last 14 days? No.  Do you have any respiratory symptoms of shortness of breath or cough now or in the last 14 days? No.  Do you have any family members or close contacts with diagnosed or suspected Covid-19 in the past 14 days? No.  Have you been tested for Covid-19 and found to be positive? No.       Follow up Call-  Call back number 08/17/2019  Post procedure Call Back phone  # [phone]  Permission to leave phone message Yes     Patient questions:  Do you have a fever, pain , or abdominal swelling? No. Pain Score  0 *  Have you tolerated food without any problems? Yes.    Have you been able to return to your normal activities? Yes.    Do you have any questions about your discharge instructions: Diet   No. Medications  No. Follow up visit  No.  Do you have questions or concerns about your Care? No.  Actions: * If pain score is 4 or above: No action needed, pain <4.

## 2019-08-22 ENCOUNTER — Encounter: Payer: Self-pay | Admitting: Gastroenterology

## 2019-11-20 ENCOUNTER — Other Ambulatory Visit: Payer: Self-pay | Admitting: Gastroenterology

## 2024-05-13 ENCOUNTER — Other Ambulatory Visit: Payer: Self-pay

## 2024-05-13 ENCOUNTER — Encounter (HOSPITAL_COMMUNITY): Payer: Self-pay

## 2024-05-13 ENCOUNTER — Emergency Department (HOSPITAL_COMMUNITY): Payer: Worker's Compensation

## 2024-05-13 ENCOUNTER — Inpatient Hospital Stay (HOSPITAL_COMMUNITY)
Admission: EM | Admit: 2024-05-13 | Discharge: 2024-05-28 | DRG: 958 | Disposition: A | Discharge status: Home / Self Care | Payer: Worker's Compensation | Attending: Surgery | Admitting: Surgery

## 2024-05-13 ENCOUNTER — Inpatient Hospital Stay (HOSPITAL_COMMUNITY): Payer: Worker's Compensation

## 2024-05-13 ENCOUNTER — Emergency Department (HOSPITAL_COMMUNITY): Payer: Self-pay

## 2024-05-13 DIAGNOSIS — E876 Hypokalemia: Secondary | ICD-10-CM | POA: Diagnosis present

## 2024-05-13 DIAGNOSIS — G40909 Epilepsy, unspecified, not intractable, without status epilepticus: Secondary | ICD-10-CM | POA: Diagnosis present

## 2024-05-13 DIAGNOSIS — S80811A Abrasion, right lower leg, initial encounter: Secondary | ICD-10-CM | POA: Diagnosis present

## 2024-05-13 DIAGNOSIS — Z6838 Body mass index (BMI) 38.0-38.9, adult: Secondary | ICD-10-CM

## 2024-05-13 DIAGNOSIS — J939 Pneumothorax, unspecified: Principal | ICD-10-CM

## 2024-05-13 DIAGNOSIS — D62 Acute posthemorrhagic anemia: Secondary | ICD-10-CM | POA: Diagnosis not present

## 2024-05-13 DIAGNOSIS — S22079A Unspecified fracture of T9-T10 vertebra, initial encounter for closed fracture: Secondary | ICD-10-CM | POA: Diagnosis present

## 2024-05-13 DIAGNOSIS — N280 Ischemia and infarction of kidney: Secondary | ICD-10-CM | POA: Diagnosis present

## 2024-05-13 DIAGNOSIS — E871 Hypo-osmolality and hyponatremia: Secondary | ICD-10-CM | POA: Diagnosis present

## 2024-05-13 DIAGNOSIS — J90 Pleural effusion, not elsewhere classified: Secondary | ICD-10-CM | POA: Diagnosis present

## 2024-05-13 DIAGNOSIS — S2242XA Multiple fractures of ribs, left side, initial encounter for closed fracture: Secondary | ICD-10-CM | POA: Diagnosis present

## 2024-05-13 DIAGNOSIS — I959 Hypotension, unspecified: Secondary | ICD-10-CM | POA: Diagnosis present

## 2024-05-13 DIAGNOSIS — K219 Gastro-esophageal reflux disease without esophagitis: Secondary | ICD-10-CM | POA: Diagnosis present

## 2024-05-13 DIAGNOSIS — W1789XA Other fall from one level to another, initial encounter: Secondary | ICD-10-CM | POA: Diagnosis present

## 2024-05-13 DIAGNOSIS — D72829 Elevated white blood cell count, unspecified: Secondary | ICD-10-CM | POA: Diagnosis not present

## 2024-05-13 DIAGNOSIS — S272XXA Traumatic hemopneumothorax, initial encounter: Principal | ICD-10-CM | POA: Diagnosis present

## 2024-05-13 DIAGNOSIS — W231XXA Caught, crushed, jammed, or pinched between stationary objects, initial encounter: Secondary | ICD-10-CM | POA: Diagnosis present

## 2024-05-13 DIAGNOSIS — Y990 Civilian activity done for income or pay: Secondary | ICD-10-CM

## 2024-05-13 DIAGNOSIS — E669 Obesity, unspecified: Secondary | ICD-10-CM | POA: Diagnosis present

## 2024-05-13 DIAGNOSIS — K567 Ileus, unspecified: Secondary | ICD-10-CM | POA: Diagnosis not present

## 2024-05-13 DIAGNOSIS — S32019A Unspecified fracture of first lumbar vertebra, initial encounter for closed fracture: Secondary | ICD-10-CM | POA: Diagnosis present

## 2024-05-13 DIAGNOSIS — E872 Acidosis, unspecified: Secondary | ICD-10-CM | POA: Diagnosis present

## 2024-05-13 DIAGNOSIS — R000 Tachycardia, unspecified: Secondary | ICD-10-CM | POA: Diagnosis present

## 2024-05-13 DIAGNOSIS — J9811 Atelectasis: Secondary | ICD-10-CM | POA: Diagnosis not present

## 2024-05-13 DIAGNOSIS — S271XXA Traumatic hemothorax, initial encounter: Secondary | ICD-10-CM | POA: Diagnosis not present

## 2024-05-13 DIAGNOSIS — S37812A Contusion of adrenal gland, initial encounter: Secondary | ICD-10-CM | POA: Diagnosis present

## 2024-05-13 DIAGNOSIS — Z79899 Other long term (current) drug therapy: Secondary | ICD-10-CM

## 2024-05-13 DIAGNOSIS — S36030A Superficial (capsular) laceration of spleen, initial encounter: Secondary | ICD-10-CM | POA: Diagnosis present

## 2024-05-13 DIAGNOSIS — I10 Essential (primary) hypertension: Secondary | ICD-10-CM | POA: Diagnosis present

## 2024-05-13 DIAGNOSIS — M25561 Pain in right knee: Secondary | ICD-10-CM | POA: Diagnosis present

## 2024-05-13 DIAGNOSIS — F419 Anxiety disorder, unspecified: Secondary | ICD-10-CM | POA: Diagnosis present

## 2024-05-13 DIAGNOSIS — S3600XA Unspecified injury of spleen, initial encounter: Secondary | ICD-10-CM | POA: Diagnosis present

## 2024-05-13 HISTORY — PX: IR ANGIOGRAM SELECTIVE EACH ADDITIONAL VESSEL: IMG667

## 2024-05-13 HISTORY — PX: IR EMBO ART  VEN HEMORR LYMPH EXTRAV  INC GUIDE ROADMAPPING: IMG5450

## 2024-05-13 HISTORY — DX: Essential (primary) hypertension: I10

## 2024-05-13 HISTORY — PX: IR ANGIOGRAM VISCERAL SELECTIVE: IMG657

## 2024-05-13 HISTORY — DX: Unspecified convulsions: R56.9

## 2024-05-13 HISTORY — PX: IR US GUIDE VASC ACCESS LEFT: IMG2389

## 2024-05-13 LAB — URINALYSIS, ROUTINE W REFLEX MICROSCOPIC
Bacteria, UA: NONE SEEN
Bilirubin Urine: NEGATIVE
Glucose, UA: NEGATIVE mg/dL
Ketones, ur: NEGATIVE mg/dL
Leukocytes,Ua: NEGATIVE
Nitrite: NEGATIVE
Protein, ur: NEGATIVE mg/dL
Specific Gravity, Urine: >1.046 — ABNORMAL HIGH (ref 1.005–1.030)
pH: 5 (ref 5.0–8.0)

## 2024-05-13 LAB — CBC
HCT: 43.1 % (ref 39.0–52.0)
HCT: 37.9 % — ABNORMAL LOW (ref 39.0–52.0)
HCT: 34.4 % — ABNORMAL LOW (ref 39.0–52.0)
Hemoglobin: 13.5 g/dL (ref 13.0–17.0)
Hemoglobin: 12.3 g/dL — ABNORMAL LOW (ref 13.0–17.0)
Hemoglobin: 11.2 g/dL — ABNORMAL LOW (ref 13.0–17.0)
MCH: 26.9 pg (ref 26.0–34.0)
MCH: 27.8 pg (ref 26.0–34.0)
MCH: 27.9 pg (ref 26.0–34.0)
MCHC: 31.3 g/dL (ref 30.0–36.0)
MCHC: 32.5 g/dL (ref 30.0–36.0)
MCHC: 32.6 g/dL (ref 30.0–36.0)
MCV: 86 fL (ref 80.0–100.0)
MCV: 85.7 fL (ref 80.0–100.0)
MCV: 85.6 fL (ref 80.0–100.0)
Platelets: 258 10*3/uL (ref 150–400)
Platelets: 183 10*3/uL (ref 150–400)
Platelets: 173 10*3/uL (ref 150–400)
RBC: 5.01 MIL/uL (ref 4.22–5.81)
RBC: 4.42 MIL/uL (ref 4.22–5.81)
RBC: 4.02 MIL/uL — ABNORMAL LOW (ref 4.22–5.81)
RDW: 14.2 % (ref 11.5–15.5)
RDW: 14.2 % (ref 11.5–15.5)
RDW: 14.2 % (ref 11.5–15.5)
WBC: 14.9 10*3/uL — ABNORMAL HIGH (ref 4.0–10.5)
WBC: 14.4 10*3/uL — ABNORMAL HIGH (ref 4.0–10.5)
WBC: 11.3 10*3/uL — ABNORMAL HIGH (ref 4.0–10.5)
nRBC: 0 % (ref 0.0–0.2)
nRBC: 0 % (ref 0.0–0.2)
nRBC: 0 % (ref 0.0–0.2)

## 2024-05-13 LAB — COMPREHENSIVE METABOLIC PANEL WITH GFR
ALT: 139 U/L — ABNORMAL HIGH (ref 0–44)
AST: 160 U/L — ABNORMAL HIGH (ref 15–41)
Albumin: 2.8 g/dL — ABNORMAL LOW (ref 3.5–5.0)
Alkaline Phosphatase: 39 U/L (ref 38–126)
Anion gap: 15 (ref 5–15)
BUN: 15 mg/dL (ref 6–20)
CO2: 18 mmol/L — ABNORMAL LOW (ref 22–32)
Calcium: 7.6 mg/dL — ABNORMAL LOW (ref 8.9–10.3)
Chloride: 106 mmol/L (ref 98–111)
Creatinine, Ser: 1.25 mg/dL — ABNORMAL HIGH (ref 0.61–1.24)
GFR, Estimated: >60 mL/min (ref >60)
Glucose, Bld: 172 mg/dL — ABNORMAL HIGH (ref 70–99)
Potassium: 3 mmol/L — ABNORMAL LOW (ref 3.5–5.1)
Sodium: 139 mmol/L (ref 135–145)
Total Bilirubin: 0.9 mg/dL (ref 0.0–1.2)
Total Protein: 4.9 g/dL — ABNORMAL LOW (ref 6.5–8.1)

## 2024-05-13 LAB — I-STAT CHEM 8, ED
BUN: 15 mg/dL (ref 6–20)
Calcium, Ion: 0.98 mmol/L — ABNORMAL LOW (ref 1.15–1.40)
Chloride: 103 mmol/L (ref 98–111)
Creatinine, Ser: 1.1 mg/dL (ref 0.61–1.24)
Glucose, Bld: 172 mg/dL — ABNORMAL HIGH (ref 70–99)
HCT: 42 % (ref 39.0–52.0)
Hemoglobin: 14.3 g/dL (ref 13.0–17.0)
Potassium: 2.7 mmol/L — CL (ref 3.5–5.1)
Sodium: 140 mmol/L (ref 135–145)
TCO2: 21 mmol/L — ABNORMAL LOW (ref 22–32)

## 2024-05-13 LAB — I-STAT CG4 LACTIC ACID, ED: Lactic Acid, Venous: 6.6 mmol/L (ref 0.5–1.9)

## 2024-05-13 LAB — SAMPLE TO BLOOD BANK

## 2024-05-13 LAB — PROTIME-INR
INR: 1.2 (ref 0.8–1.2)
Prothrombin Time: 15.4 s — ABNORMAL HIGH (ref 11.4–15.2)

## 2024-05-13 LAB — ETHANOL: Alcohol, Ethyl (B): <15 mg/dL (ref <15)

## 2024-05-13 LAB — HIV ANTIBODY (ROUTINE TESTING W REFLEX): HIV Screen 4th Generation wRfx: NONREACTIVE

## 2024-05-13 LAB — ABO/RH: ABO/RH(D): A POS

## 2024-05-13 MED ORDER — HYDROMORPHONE HCL 1 MG/ML IJ SOLN
INTRAMUSCULAR | Status: AC
  Filled 2024-05-13: qty 1

## 2024-05-13 MED ORDER — POLYETHYLENE GLYCOL 3350 17 G PO PACK
17.0000 g | PACK | Freq: Every day | ORAL | Status: DC | PRN
  Administered 2024-05-15 – 2024-05-28 (×3): 17 g via ORAL
  Filled 2024-05-13 (×3): qty 1

## 2024-05-13 MED ORDER — PANTOPRAZOLE SODIUM 40 MG PO TBEC
40.0000 mg | DELAYED_RELEASE_TABLET | Freq: Every day | ORAL | Status: DC
  Administered 2024-05-13 – 2024-05-28 (×15): 40 mg via ORAL
  Filled 2024-05-13 (×16): qty 1

## 2024-05-13 MED ORDER — FENTANYL CITRATE PF 50 MCG/ML IJ SOSY
100.0000 ug | PREFILLED_SYRINGE | Freq: Once | INTRAMUSCULAR | Status: DC
  Filled 2024-05-13: qty 2

## 2024-05-13 MED ORDER — FENTANYL CITRATE PF 50 MCG/ML IJ SOSY
PREFILLED_SYRINGE | INTRAMUSCULAR | Status: AC | PRN
  Administered 2024-05-13: 50 ug via INTRAVENOUS

## 2024-05-13 MED ORDER — FENTANYL CITRATE (PF) 100 MCG/2ML IJ SOLN
INTRAMUSCULAR | Status: AC | PRN
  Administered 2024-05-13: 100 ug via INTRAVENOUS

## 2024-05-13 MED ORDER — OXYCODONE HCL 5 MG PO TABS
5.0000 mg | ORAL_TABLET | ORAL | Status: DC | PRN
  Administered 2024-05-13 (×3): 5 mg via ORAL
  Filled 2024-05-13 (×2): qty 1

## 2024-05-13 MED ORDER — HEPARIN SODIUM (PORCINE) 1000 UNIT/ML IJ SOLN
INTRAMUSCULAR | Status: AC
  Filled 2024-05-13: qty 10

## 2024-05-13 MED ORDER — DIVALPROEX SODIUM 500 MG PO DR TAB
1000.0000 mg | DELAYED_RELEASE_TABLET | Freq: Every day | ORAL | Status: DC
  Administered 2024-05-13 – 2024-05-27 (×15): 1000 mg via ORAL
  Filled 2024-05-13 (×16): qty 2

## 2024-05-13 MED ORDER — FENTANYL CITRATE (PF) 100 MCG/2ML IJ SOLN
INTRAMUSCULAR | Status: AC | PRN
  Administered 2024-05-13: 25 ug via INTRAVENOUS
  Administered 2024-05-13: 50 ug via INTRAVENOUS

## 2024-05-13 MED ORDER — SODIUM CHLORIDE 0.9% FLUSH
10.0000 mL | Freq: Two times a day (BID) | INTRAVENOUS | Status: DC
  Administered 2024-05-13 – 2024-05-15 (×4): 10 mL
  Administered 2024-05-15: 30 mL
  Administered 2024-05-16 – 2024-05-18 (×5): 10 mL
  Administered 2024-05-19: 20 mL
  Administered 2024-05-19: 10 mL
  Administered 2024-05-20: 20 mL
  Administered 2024-05-20 – 2024-05-28 (×15): 10 mL

## 2024-05-13 MED ORDER — ORAL CARE MOUTH RINSE: 15.0000 mL | OROMUCOSAL | Status: DC | PRN

## 2024-05-13 MED ORDER — CLONAZEPAM 0.5 MG PO TABS
0.5000 mg | ORAL_TABLET | Freq: Two times a day (BID) | ORAL | Status: DC | PRN
  Administered 2024-05-13 – 2024-05-24 (×4): 0.5 mg via ORAL
  Filled 2024-05-13 (×5): qty 1

## 2024-05-13 MED ORDER — ALBUMIN HUMAN 5 % IV SOLN
12.5000 g | Freq: Once | INTRAVENOUS | Status: AC
  Administered 2024-05-13: 12.5 g via INTRAVENOUS
  Filled 2024-05-13: qty 250

## 2024-05-13 MED ORDER — SODIUM CHLORIDE 0.9% IV SOLUTION: Freq: Once | INTRAVENOUS | Status: DC

## 2024-05-13 MED ORDER — SODIUM CHLORIDE 0.9 % IV SOLN
INTRAVENOUS | Status: AC | PRN
  Administered 2024-05-13: 1000 mL via INTRAVENOUS

## 2024-05-13 MED ORDER — SODIUM CHLORIDE 0.9% FLUSH: 10.0000 mL | INTRAVENOUS | Status: DC | PRN

## 2024-05-13 MED ORDER — LACTATED RINGERS IV SOLN: INTRAVENOUS | Status: AC

## 2024-05-13 MED ORDER — SODIUM CHLORIDE 0.9 % IV BOLUS
INTRAVENOUS | Status: AC | PRN
  Administered 2024-05-13: 500 mL via INTRAVENOUS

## 2024-05-13 MED ORDER — ONDANSETRON 4 MG PO TBDP
4.0000 mg | ORAL_TABLET | Freq: Four times a day (QID) | ORAL | Status: DC | PRN
  Administered 2024-05-23 – 2024-05-24 (×2): 4 mg via ORAL
  Filled 2024-05-13 (×2): qty 1

## 2024-05-13 MED ORDER — ALBUMIN HUMAN 5 % IV SOLN
INTRAVENOUS | Status: AC
  Filled 2024-05-13: qty 250

## 2024-05-13 MED ORDER — ALBUMIN HUMAN 5 % IV SOLN
12.5000 g | Freq: Once | INTRAVENOUS | Status: AC
  Administered 2024-05-13: 12.5 g via INTRAVENOUS

## 2024-05-13 MED ORDER — DOCUSATE SODIUM 100 MG PO CAPS
100.0000 mg | ORAL_CAPSULE | Freq: Two times a day (BID) | ORAL | Status: DC
  Administered 2024-05-13 – 2024-05-28 (×30): 100 mg via ORAL
  Filled 2024-05-13 (×31): qty 1

## 2024-05-13 MED ORDER — LIDOCAINE HCL 1 % IJ SOLN
20.0000 mL | Freq: Once | INTRAMUSCULAR | Status: AC
  Administered 2024-05-13: 2 mL via INTRADERMAL

## 2024-05-13 MED ORDER — VERAPAMIL HCL 2.5 MG/ML IV SOLN: INTRA_ARTERIAL | Status: AC | PRN

## 2024-05-13 MED ORDER — FENTANYL CITRATE PF 50 MCG/ML IJ SOSY
PREFILLED_SYRINGE | INTRAMUSCULAR | Status: AC
  Filled 2024-05-13: qty 1

## 2024-05-13 MED ORDER — METHOCARBAMOL 1000 MG/10ML IJ SOLN
1000.0000 mg | Freq: Once | INTRAMUSCULAR | Status: AC
  Administered 2024-05-13: 1000 mg via INTRAVENOUS
  Filled 2024-05-13: qty 10

## 2024-05-13 MED ORDER — IOHEXOL 300 MG/ML  SOLN
150.0000 mL | Freq: Once | INTRAMUSCULAR | Status: AC | PRN
  Administered 2024-05-13: 70 mL via INTRA_ARTERIAL

## 2024-05-13 MED ORDER — DIVALPROEX SODIUM 500 MG PO DR TAB
500.0000 mg | DELAYED_RELEASE_TABLET | Freq: Every day | ORAL | Status: DC
  Administered 2024-05-14 – 2024-05-28 (×15): 500 mg via ORAL
  Filled 2024-05-13 (×15): qty 1

## 2024-05-13 MED ORDER — HYDROMORPHONE HCL 1 MG/ML IJ SOLN
INTRAMUSCULAR | Status: AC | PRN
  Administered 2024-05-13: 1 mg via INTRAVENOUS

## 2024-05-13 MED ORDER — CALCIUM GLUCONATE-NACL 2-0.675 GM/100ML-% IV SOLN
2.0000 g | Freq: Once | INTRAVENOUS | Status: AC
  Administered 2024-05-13: 2000 mg via INTRAVENOUS
  Filled 2024-05-13: qty 100

## 2024-05-13 MED ORDER — PANTOPRAZOLE SODIUM 40 MG IV SOLR
40.0000 mg | Freq: Every day | INTRAVENOUS | Status: DC
  Administered 2024-05-15: 40 mg via INTRAVENOUS
  Filled 2024-05-13 (×3): qty 10

## 2024-05-13 MED ORDER — IOHEXOL 350 MG/ML SOLN
75.0000 mL | Freq: Once | INTRAVENOUS | Status: AC | PRN
  Administered 2024-05-13: 75 mL via INTRAVENOUS

## 2024-05-13 MED ORDER — DIVALPROEX SODIUM 250 MG PO DR TAB
500.0000 mg | DELAYED_RELEASE_TABLET | Freq: Three times a day (TID) | ORAL | Status: DC
  Filled 2024-05-13 (×2): qty 2

## 2024-05-13 MED ORDER — HYDROMORPHONE HCL 1 MG/ML IJ SOLN
1.0000 mg | INTRAMUSCULAR | Status: DC | PRN
  Administered 2024-05-13 – 2024-05-25 (×15): 1 mg via INTRAVENOUS
  Filled 2024-05-13 (×16): qty 1

## 2024-05-13 MED ORDER — METHOCARBAMOL 500 MG PO TABS
1000.0000 mg | ORAL_TABLET | Freq: Three times a day (TID) | ORAL | Status: DC
  Administered 2024-05-13 – 2024-05-28 (×43): 1000 mg via ORAL
  Filled 2024-05-13 (×44): qty 2

## 2024-05-13 MED ORDER — MIDAZOLAM HCL 2 MG/2ML IJ SOLN
INTRAMUSCULAR | Status: AC | PRN
  Administered 2024-05-13: 1 mg via INTRAVENOUS

## 2024-05-13 MED ORDER — METOPROLOL TARTRATE 5 MG/5ML IV SOLN: 5.0000 mg | Freq: Four times a day (QID) | INTRAVENOUS | Status: DC | PRN

## 2024-05-13 MED ORDER — ACETAMINOPHEN 500 MG PO TABS
1000.0000 mg | ORAL_TABLET | Freq: Four times a day (QID) | ORAL | Status: DC
  Administered 2024-05-13 – 2024-05-28 (×55): 1000 mg via ORAL
  Filled 2024-05-13 (×57): qty 2

## 2024-05-13 MED ORDER — METHOCARBAMOL 1000 MG/10ML IJ SOLN
1000.0000 mg | Freq: Three times a day (TID) | INTRAMUSCULAR | Status: DC
  Filled 2024-05-13: qty 10

## 2024-05-13 MED ORDER — IOHEXOL 300 MG/ML  SOLN
100.0000 mL | Freq: Once | INTRAMUSCULAR | Status: AC | PRN
  Administered 2024-05-13: 50 mL via INTRA_ARTERIAL

## 2024-05-13 MED ORDER — NOREPINEPHRINE 4 MG/250ML-% IV SOLN
0.0000 ug/min | INTRAVENOUS | Status: DC
  Administered 2024-05-13: 2 ug/min via INTRAVENOUS

## 2024-05-13 MED ORDER — CHLORHEXIDINE GLUCONATE CLOTH 2 % EX PADS
6.0000 | MEDICATED_PAD | Freq: Every day | CUTANEOUS | Status: DC
  Administered 2024-05-13 – 2024-05-22 (×9): 6 via TOPICAL

## 2024-05-13 MED ORDER — MIDAZOLAM HCL 2 MG/2ML IJ SOLN
INTRAMUSCULAR | Status: AC
  Filled 2024-05-13: qty 2

## 2024-05-13 MED ORDER — LIDOCAINE 5 % EX PTCH
1.0000 | MEDICATED_PATCH | CUTANEOUS | Status: DC
  Administered 2024-05-13 – 2024-05-20 (×3): 1 via TRANSDERMAL
  Filled 2024-05-13 (×7): qty 1

## 2024-05-13 MED ORDER — NOREPINEPHRINE 4 MG/250ML-% IV SOLN
INTRAVENOUS | Status: AC
  Filled 2024-05-13: qty 250

## 2024-05-13 MED ORDER — GELATIN ABSORBABLE 12-7 MM EX MISC
CUTANEOUS | Status: AC
  Filled 2024-05-13: qty 1

## 2024-05-13 MED ORDER — HYDRALAZINE HCL 20 MG/ML IJ SOLN: 10.0000 mg | INTRAMUSCULAR | Status: DC | PRN

## 2024-05-13 MED ORDER — OXYCODONE HCL 5 MG PO TABS
10.0000 mg | ORAL_TABLET | ORAL | Status: DC | PRN
  Administered 2024-05-14 (×2): 10 mg via ORAL
  Filled 2024-05-13 (×3): qty 2

## 2024-05-13 MED ORDER — VERAPAMIL HCL 2.5 MG/ML IV SOLN
INTRAVENOUS | Status: AC
Start: 2024-05-13 — End: 2024-05-13
  Filled 2024-05-13: qty 2

## 2024-05-13 MED ORDER — LIDOCAINE HCL 1 % IJ SOLN
INTRAMUSCULAR | Status: AC
Start: 2024-05-13 — End: 2024-05-13
  Filled 2024-05-13: qty 20

## 2024-05-13 MED ORDER — NITROGLYCERIN 1 MG/10 ML FOR IR/CATH LAB
INTRA_ARTERIAL | Status: AC
Start: 2024-05-13 — End: 2024-05-13
  Filled 2024-05-13: qty 10

## 2024-05-13 MED ORDER — FENTANYL CITRATE (PF) 100 MCG/2ML IJ SOLN
INTRAMUSCULAR | Status: AC
Start: 2024-05-13 — End: 2024-05-13
  Filled 2024-05-13: qty 2

## 2024-05-13 MED ORDER — TETANUS-DIPHTH-ACELL PERTUSSIS 5-2.5-18.5 LF-MCG/0.5 IM SUSY
0.5000 mL | PREFILLED_SYRINGE | Freq: Once | INTRAMUSCULAR | Status: DC
Start: 2024-05-13 — End: 2024-05-28

## 2024-05-13 MED ORDER — ONDANSETRON HCL 4 MG/2ML IJ SOLN
4.0000 mg | Freq: Four times a day (QID) | INTRAMUSCULAR | Status: DC | PRN
Start: 2024-05-13 — End: 2024-05-28
  Administered 2024-05-18: 4 mg via INTRAVENOUS
  Filled 2024-05-13: qty 2

## 2024-05-13 NOTE — TOC CAGE-AID Note (Signed)
 Transition of Care [facility]) - CAGE-AID Screening  Patient Details  Name: Mike Miranda MRN: [id number] Date of Birth: Dec 15, 1980  Clinical Narrative:  Patient endorses very occasional alcohol use. He denies any illicit drug use. Patient denies need for substance abuse resources at this time.  CAGE-AID Screening:   Have You Ever Felt You Ought to Cut Down on Your Drinking or Drug Use?: No Have People Annoyed You By Critizing Your Drinking Or Drug Use?: No Have You Felt Bad Or Guilty About Your Drinking Or Drug Use?: No Have You Ever Had a Drink or Used Drugs First Thing In The Morning to Steady Your Nerves or to Get Rid of a Hangover?: No CAGE-AID Score: 0  Substance Abuse Education Offered: No

## 2024-05-13 NOTE — Progress Notes (Signed)
 Pt gone to CT for scan.  Wife is on the way.  Anton Baton, [place], [facility], Pager [phone]

## 2024-05-13 NOTE — ED Notes (Signed)
 Wife contacted and is on the way

## 2024-05-13 NOTE — ED Notes (Signed)
 Pt transported to IR at this time.

## 2024-05-13 NOTE — Progress Notes (Signed)
 Patient ID: Mike Miranda, male   DOB: Apr 26, 1981, 43 y.o.   MRN: [id number] Patient SBP dropped to 79 - 2u whole blood given. I spoke with his wife at the bedside.  Dorena Gander, MD, MPH, FACS Please use [email or web address] to contact on call provider

## 2024-05-13 NOTE — Procedures (Signed)
 Interventional Radiology Procedure Note  Procedure: Splenic artery embolization  Complications: None  Estimated Blood Loss: < 10 mL  Findings: Left radial artery access for selective three vessel splenic artery embolization with coils.  Rosetta Cons, MD
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
## 2024-05-13 NOTE — ED Notes (Signed)
 Patient transported to CT with TRN and Trauma MD.
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
## 2024-05-13 NOTE — ED Notes (Signed)
 CCMD called and notified

## 2024-05-13 NOTE — ED Notes (Signed)
 Returned from CT.

## 2024-05-13 NOTE — Procedures (Signed)
 Central line  Date/Time: 05/13/2024 3:37 PM  Performed by: Dorena Gander, MD Authorized by: Dorena Gander, MD   Consent:    Consent obtained:  Verbal   Consent given by:  Spouse and patient Universal protocol:    Procedure explained and questions answered to patient or proxy's satisfaction: yes     Relevant documents present and verified: yes     Test results available: yes     Imaging studies available: yes     Required blood products, implants, devices, and special equipment available: yes     Immediately prior to procedure, a time out was called: yes   Pre-procedure details:    Indication(s): central venous access     Skin preparation:  Chlorhexidine  Sedation:    Sedation type:  None Anesthesia:    Anesthesia method:  Local infiltration Procedure details:    Location:  L subclavian   Patient position:  Supine   Procedural supplies:  Triple lumen   Number of attempts:  1   Successful placement: yes   Post-procedure details:    Post-procedure:  Dressing applied and line sutured   Assessment:  Blood return through all ports   Procedure completion:  Tolerated Comments:     CXR pending  Dorena Gander, MD, MPH, FACS Please use [email or web address] to contact on call provider

## 2024-05-13 NOTE — ED Notes (Signed)
 X-ray at bedside
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
## 2024-05-13 NOTE — Progress Notes (Signed)
 Patient ID: Mike Miranda, male   DOB: [birth year], 43 y.o.   MRN: [id number] Admitted to ICU S/P angioembolization. Received dilaudid  and BP dropped.  I placed a L SCV central line. Hb 12. Will give albumin . Levophed  if needed to support BP. I updated his wife at the bedside.  Dorena Gander, MD, MPH, FACS Please use [email or web address] to contact on call provider

## 2024-05-13 NOTE — ED Notes (Signed)
 Family updated as to patient's status.

## 2024-05-13 NOTE — ED Provider Notes (Signed)
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  BP: 122/87 (!) 61/50 101/70   Pulse: (!) 120 (!) 122 (!) 113 (!) 130  Resp: (!) 33 (!) 30 (!) 28 (!) 33  Temp:      SpO2: 98% 99% 98% 90%  Weight:      Height:        Final diagnoses:  Injury of spleen, initial encounter    Admission/ observation were discussed with the admitting physician, patient and/or family and they are comfortable with the plan.       Final diagnoses:  Injury of spleen, initial encounter    ED Discharge Orders     None          Albertus Hughs, DO 05/13/24 1529

## 2024-05-14 ENCOUNTER — Inpatient Hospital Stay (HOSPITAL_COMMUNITY): Payer: Worker's Compensation

## 2024-05-14 LAB — BASIC METABOLIC PANEL WITH GFR
Anion gap: 12 (ref 5–15)
BUN: 13 mg/dL (ref 6–20)
CO2: 20 mmol/L — ABNORMAL LOW (ref 22–32)
Calcium: 7.9 mg/dL — ABNORMAL LOW (ref 8.9–10.3)
Chloride: 104 mmol/L (ref 98–111)
Creatinine, Ser: 1.11 mg/dL (ref 0.61–1.24)
GFR, Estimated: >60 mL/min (ref >60)
Glucose, Bld: 151 mg/dL — ABNORMAL HIGH (ref 70–99)
Potassium: 4.3 mmol/L (ref 3.5–5.1)
Sodium: 136 mmol/L (ref 135–145)

## 2024-05-14 LAB — CBC
HCT: 32.8 % — ABNORMAL LOW (ref 39.0–52.0)
HCT: 30.9 % — ABNORMAL LOW (ref 39.0–52.0)
HCT: 30.4 % — ABNORMAL LOW (ref 39.0–52.0)
Hemoglobin: 10.9 g/dL — ABNORMAL LOW (ref 13.0–17.0)
Hemoglobin: 10.3 g/dL — ABNORMAL LOW (ref 13.0–17.0)
Hemoglobin: 10.1 g/dL — ABNORMAL LOW (ref 13.0–17.0)
MCH: 27.9 pg (ref 26.0–34.0)
MCH: 28 pg (ref 26.0–34.0)
MCH: 27.9 pg (ref 26.0–34.0)
MCHC: 33.2 g/dL (ref 30.0–36.0)
MCHC: 33.3 g/dL (ref 30.0–36.0)
MCHC: 33.2 g/dL (ref 30.0–36.0)
MCV: 83.9 fL (ref 80.0–100.0)
MCV: 84 fL (ref 80.0–100.0)
MCV: 84 fL (ref 80.0–100.0)
Platelets: 157 10*3/uL (ref 150–400)
Platelets: 140 10*3/uL — ABNORMAL LOW (ref 150–400)
Platelets: 142 10*3/uL — ABNORMAL LOW (ref 150–400)
RBC: 3.91 MIL/uL — ABNORMAL LOW (ref 4.22–5.81)
RBC: 3.68 MIL/uL — ABNORMAL LOW (ref 4.22–5.81)
RBC: 3.62 MIL/uL — ABNORMAL LOW (ref 4.22–5.81)
RDW: 14.5 % (ref 11.5–15.5)
RDW: 14.6 % (ref 11.5–15.5)
RDW: 14.6 % (ref 11.5–15.5)
WBC: 7.9 10*3/uL (ref 4.0–10.5)
WBC: 8.8 10*3/uL (ref 4.0–10.5)
WBC: 10.8 10*3/uL — ABNORMAL HIGH (ref 4.0–10.5)
nRBC: 0 % (ref 0.0–0.2)
nRBC: 0 % (ref 0.0–0.2)
nRBC: 0 % (ref 0.0–0.2)

## 2024-05-14 LAB — TYPE AND SCREEN
ABO/RH(D): A POS
Antibody Screen: NEGATIVE
Unit division: 0
Unit division: 0

## 2024-05-14 LAB — BPAM RBC
Blood Product Expiration Date: 202506232359
Blood Product Expiration Date: 202506232359
ISSUE DATE / TIME: 202506121223
ISSUE DATE / TIME: 202506121232
Unit Type and Rh: 5100
Unit Type and Rh: 5100

## 2024-05-14 MED ORDER — FENTANYL CITRATE PF 50 MCG/ML IJ SOSY
PREFILLED_SYRINGE | INTRAMUSCULAR | Status: AC
  Filled 2024-05-14: qty 2

## 2024-05-14 MED ORDER — LIDOCAINE HCL (PF) 1 % IJ SOLN
INTRAMUSCULAR | Status: AC
  Filled 2024-05-14: qty 5

## 2024-05-14 MED ORDER — FENTANYL CITRATE PF 50 MCG/ML IJ SOSY
100.0000 ug | PREFILLED_SYRINGE | Freq: Once | INTRAMUSCULAR | Status: AC
  Administered 2024-05-14: 100 ug via INTRAVENOUS

## 2024-05-14 MED ORDER — LIDOCAINE HCL (PF) 1 % IJ SOLN: 30.0000 mL | Freq: Once | INTRAMUSCULAR | Status: AC

## 2024-05-14 MED ORDER — OXYCODONE HCL 5 MG PO TABS
15.0000 mg | ORAL_TABLET | ORAL | Status: DC | PRN
  Administered 2024-05-14 – 2024-05-25 (×22): 15 mg via ORAL
  Filled 2024-05-14 (×24): qty 3

## 2024-05-14 MED ORDER — MIDAZOLAM HCL 2 MG/2ML IJ SOLN
INTRAMUSCULAR | Status: AC
  Filled 2024-05-14: qty 2

## 2024-05-14 MED ORDER — LIDOCAINE HCL (CARDIAC) PF 100 MG/5ML IV SOSY
PREFILLED_SYRINGE | INTRAVENOUS | Status: AC
  Filled 2024-05-14: qty 5

## 2024-05-14 MED ORDER — TRAMADOL HCL 50 MG PO TABS
50.0000 mg | ORAL_TABLET | Freq: Four times a day (QID) | ORAL | Status: DC
  Administered 2024-05-14 – 2024-05-25 (×43): 50 mg via ORAL
  Filled 2024-05-14 (×44): qty 1

## 2024-05-14 MED ORDER — LIDOCAINE HCL (PF) 1 % IJ SOLN
INTRAMUSCULAR | Status: AC
  Administered 2024-05-14: 30 mL
  Filled 2024-05-14: qty 30

## 2024-05-14 MED ORDER — MIDAZOLAM HCL 2 MG/2ML IJ SOLN
2.0000 mg | Freq: Once | INTRAMUSCULAR | Status: AC
  Administered 2024-05-14: 2 mg via INTRAVENOUS

## 2024-05-14 MED ORDER — FENTANYL CITRATE PF 50 MCG/ML IJ SOSY
100.0000 ug | PREFILLED_SYRINGE | Freq: Once | INTRAMUSCULAR | Status: AC
  Filled 2024-05-14: qty 2

## 2024-05-14 MED ORDER — OXYCODONE HCL 5 MG PO TABS: 10.0000 mg | ORAL_TABLET | ORAL | Status: DC | PRN
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
## 2024-05-14 NOTE — Procedures (Signed)
 Chest Tube Insertion Procedure Note  Indications:  Clinically significant Hemothorax  Pre-operative Diagnosis: Hemothorax  Post-operative Diagnosis: Hemothorax  Procedure Details  Informed consent was obtained for the procedure, including sedation.  Risks of incomplete drainage of hemothorax, lung injury, bleeding, arrhythmia, and pain/discomfort were discussed.   Medications used: 2 mg versed , 200 mcg fentanyl , 30 mL intradermal lidocaine    After sterile skin prep, using standard technique, a 20 French tube was placed in the left anterior-lateral 5th intercostal space.  Findings: 850 ml of dark blood obtained  Estimated Blood Loss:  Minimal, procedural          Specimens:  None              Complications:  None; patient tolerated the procedure well.         Disposition: ICU - extubated and stable.         Condition: stable  CXR ordered.   [name], PA-C [facility] Please see Amion for pager number during day hours 7:00am-4:30pm

## 2024-05-15 ENCOUNTER — Inpatient Hospital Stay (HOSPITAL_COMMUNITY): Payer: Worker's Compensation

## 2024-05-15 DIAGNOSIS — S271XXA Traumatic hemothorax, initial encounter: Secondary | ICD-10-CM

## 2024-05-15 DIAGNOSIS — J9811 Atelectasis: Secondary | ICD-10-CM

## 2024-05-15 DIAGNOSIS — S2242XA Multiple fractures of ribs, left side, initial encounter for closed fracture: Secondary | ICD-10-CM

## 2024-05-15 LAB — BASIC METABOLIC PANEL WITH GFR
Anion gap: 8 (ref 5–15)
BUN: 16 mg/dL (ref 6–20)
CO2: 24 mmol/L (ref 22–32)
Calcium: 7.9 mg/dL — ABNORMAL LOW (ref 8.9–10.3)
Chloride: 100 mmol/L (ref 98–111)
Creatinine, Ser: 1.17 mg/dL (ref 0.61–1.24)
GFR, Estimated: >60 mL/min (ref >60)
Glucose, Bld: 122 mg/dL — ABNORMAL HIGH (ref 70–99)
Potassium: 4.6 mmol/L (ref 3.5–5.1)
Sodium: 132 mmol/L — ABNORMAL LOW (ref 135–145)

## 2024-05-15 LAB — CBC
HCT: 30 % — ABNORMAL LOW (ref 39.0–52.0)
HCT: 28.4 % — ABNORMAL LOW (ref 39.0–52.0)
Hemoglobin: 9.9 g/dL — ABNORMAL LOW (ref 13.0–17.0)
Hemoglobin: 9.3 g/dL — ABNORMAL LOW (ref 13.0–17.0)
MCH: 27.7 pg (ref 26.0–34.0)
MCH: 27.4 pg (ref 26.0–34.0)
MCHC: 33 g/dL (ref 30.0–36.0)
MCHC: 32.7 g/dL (ref 30.0–36.0)
MCV: 84 fL (ref 80.0–100.0)
MCV: 83.8 fL (ref 80.0–100.0)
Platelets: 137 10*3/uL — ABNORMAL LOW (ref 150–400)
Platelets: 127 10*3/uL — ABNORMAL LOW (ref 150–400)
RBC: 3.57 MIL/uL — ABNORMAL LOW (ref 4.22–5.81)
RBC: 3.39 MIL/uL — ABNORMAL LOW (ref 4.22–5.81)
RDW: 14.4 % (ref 11.5–15.5)
RDW: 14.4 % (ref 11.5–15.5)
WBC: 12.2 10*3/uL — ABNORMAL HIGH (ref 4.0–10.5)
WBC: 13 10*3/uL — ABNORMAL HIGH (ref 4.0–10.5)
nRBC: 0 % (ref 0.0–0.2)
nRBC: 0 % (ref 0.0–0.2)

## 2024-05-15 MED ORDER — LACTATED RINGERS IV BOLUS
1000.0000 mL | Freq: Once | INTRAVENOUS | Status: AC
  Administered 2024-05-15: 1000 mL via INTRAVENOUS

## 2024-05-15 MED ORDER — LACTATED RINGERS IV BOLUS
500.0000 mL | Freq: Once | INTRAVENOUS | Status: AC
  Administered 2024-05-15: 500 mL via INTRAVENOUS

## 2024-05-15 NOTE — Progress Notes (Signed)
 Inpatient Rehab Admissions Coordinator Note:   Per therapy patient was screened for CIR candidacy by Evgenia Merriman Annell Barrow, CCC-SLP. At this time, pt appears to be a potential candidate for CIR. I will place an order for rehab consult for full assessment, per our protocol.  Please contact me any with questions.Artemus Larsen, MS, CCC-SLP Admissions Coordinator [phone] 05/15/24 6:06 PM

## 2024-05-15 NOTE — Progress Notes (Addendum)
 Notified Dr. Aniceto Barley of pt's HR sustaining in mid 130's.  Multiple pain meds given for pain.  After about 1hr, pt sleeping and HR still in the 130's.  Pt's temp 98.2.  Pt's fluids turned off earlier in the shift, but pt drinking an adequate amount.  Chest tube placed during the day.  Output from CT from time of insertion until now 1900 mL.  Last hgb drawn at 1630 was 10.1.  Verbal order for CBC and 500cc LR bolus.
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
## 2024-05-15 NOTE — Consult Note (Addendum)
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 pelvis performed on 05/13/2024 demonstrates active  extravasation in the upper pole of the splenic parenchyma. EXAM: Mesenteric angiogram with splenic artery embolization, selective common multiple branches MEDICATIONS: . The antibiotic was administered within 1 hour of the procedure ANESTHESIA/SEDATION: Moderate (conscious) sedation was employed during this procedure. A total of Versed  1 mg and Fentanyl  75 mcg was administered intravenously by the radiology nurse. Total intra-service moderate Sedation Time: 59 minutes. The patient's level of consciousness and vital signs were monitored continuously by radiology nursing throughout the procedure under my direct supervision. CONTRAST:  One hundred twenty mL Omnipaque  300 FLUOROSCOPY: Radiation Exposure Index (as provided by the fluoroscopic device): 1,046 mGy Kerma COMPLICATIONS: None immediate. PROCEDURE: Informed consent was obtained from the patient following explanation of the procedure, risks, benefits and alternatives. The patient understands, agrees and consents for the procedure. All questions were addressed. A time out was performed prior to the initiation of the procedure. Maximal barrier sterile technique utilized including caps, mask, sterile gowns,  sterile gloves, large sterile drape, hand hygiene, and Betadine prep. With patient in supine position and the left arm prepped draped in the usual sterile fashion, ultrasound was used to evaluate the left radial artery. The left radial artery was anechoic and pulsatile indicating patency. Skin was infiltrated with 1% lidocaine . The needle was advanced from the skin into the midpoint of the radial artery under ultrasound guidance. A final image was obtained of the arterial access and stored in the patient's permanent medical record. Access was exchanged for Bentson guidewire and with 3 5 catheter through a radial artery sheath. Radial cocktail was administered through the side arm of the sheath. The guidewire and catheter were advanced to the subclavian artery and subsequently into the descending thoracic aorta under fluoroscopy guidance. With the catheter was below the diaphragm, the celiac trunk was selected and a celiac angiogram was performed. No active extravasation identified initially. Catheter and guidewire were then used to selectively cannulate the splenic artery. The catheter was advanced to the midpoint within the main splenic artery and repeat angiogram was performed. The superior pole is identified by not be perfused well with several branches feeding this region and abruptly terminating and appearing to be vasospastic. The splenic artery appears to be of a bifurcation tight branching pattern with a superior pole and an inferior pole artery the superior pole artery branches into a more widely distributed arterial branching system and at least 2 of the arterial branches are feeding the region of the superior pole region the does not appear to be perfused corresponding with the active hemorrhage seen on CT. Coaxial technique was then used to navigate to the distal main splenic artery, advanced the microcatheter into the upper pole branch, and then advanced the microcatheter into the inferior aspect branch  point and in this artery coil embolization was performed. And interlock 6 mm x 20 cm coil was deployed. Post coil embolization angiogram performed demonstrating satisfactory embolization of this branch. The catheter was then withdrawn into the superior pole artery and then used to selectively cannulate the next caudal branch point off the superior pole splenic artery. In this vessel a 4 mm x 10 cm interlock coil was deployed. Post embolization angiogram demonstrates satisfactory embolization of this branch. The microcatheter was then withdrawn and used to selectively cannulate the inferior pole branch and advanced the wire and catheter system into an ascending capsular branch which also appears to be filling this region. This artery was embolized using a 4 mm by 10 cm interlock coil. Microcatheter was again removed and  a repeat splenic angiogram was performed from the main splenic artery the 035 catheter and no further extravasation identified no other definite targets identified. All catheters and sheath removed from the patient.  TR band applied. IMPRESSION: Splenic artery selective embolization as described above Electronically Signed   By: Susan Ensign   On: 05/13/2024 16:47   IR Angiogram Selective Each Additional Vessel Result Date: 05/13/2024 INDICATION: Splenic laceration in a patient with crush injury and multiple fractures. CT of the abdomen and pelvis performed on 05/13/2024 demonstrates active extravasation in the upper pole of the splenic parenchyma. EXAM: Mesenteric angiogram with splenic artery embolization, selective common multiple branches MEDICATIONS: . The antibiotic was administered within 1 hour of the procedure ANESTHESIA/SEDATION: Moderate (conscious) sedation was employed during this procedure. A total of Versed  1 mg and Fentanyl  75 mcg was administered intravenously by the radiology nurse. Total intra-service moderate Sedation Time: 59 minutes. The patient's level of consciousness  and vital signs were monitored continuously by radiology nursing throughout the procedure under my direct supervision. CONTRAST:  One hundred twenty mL Omnipaque  300 FLUOROSCOPY: Radiation Exposure Index (as provided by the fluoroscopic device): 1,046 mGy Kerma COMPLICATIONS: None immediate. PROCEDURE: Informed consent was obtained from the patient following explanation of the procedure, risks, benefits and alternatives. The patient understands, agrees and consents for the procedure. All questions were addressed. A time out was performed prior to the initiation of the procedure. Maximal barrier sterile technique utilized including caps, mask, sterile gowns, sterile gloves, large sterile drape, hand hygiene, and Betadine prep. With patient in supine position and the left arm prepped draped in the usual sterile fashion, ultrasound was used to evaluate the left radial artery. The left radial artery was anechoic and pulsatile indicating patency. Skin was infiltrated with 1% lidocaine . The needle was advanced from the skin into the midpoint of the radial artery under ultrasound guidance. A final image was obtained of the arterial access and stored in the patient's permanent medical record. Access was exchanged for Bentson guidewire and with 3 5 catheter through a radial artery sheath. Radial cocktail was administered through the side arm of the sheath. The guidewire and catheter were advanced to the subclavian artery and subsequently into the descending thoracic aorta under fluoroscopy guidance. With the catheter was below the diaphragm, the celiac trunk was selected and a celiac angiogram was performed. No active extravasation identified initially. Catheter and guidewire were then used to selectively cannulate the splenic artery. The catheter was advanced to the midpoint within the main splenic artery and repeat angiogram was performed. The superior pole is identified by not be perfused well with several branches  feeding this region and abruptly terminating and appearing to be vasospastic. The splenic artery appears to be of a bifurcation tight branching pattern with a superior pole and an inferior pole artery the superior pole artery branches into a more widely distributed arterial branching system and at least 2 of the arterial branches are feeding the region of the superior pole region the does not appear to be perfused corresponding with the active hemorrhage seen on CT. Coaxial technique was then used to navigate to the distal main splenic artery, advanced the microcatheter into the upper pole branch, and then advanced the microcatheter into the inferior aspect branch point and in this artery coil embolization was performed. And interlock 6 mm x 20 cm coil was deployed. Post coil embolization angiogram performed demonstrating satisfactory embolization of this branch. The catheter was then withdrawn into the superior pole artery  and then used to selectively cannulate the next caudal branch point off the superior pole splenic artery. In this vessel a 4 mm x 10 cm interlock coil was deployed. Post embolization angiogram demonstrates satisfactory embolization of this branch. The microcatheter was then withdrawn and used to selectively cannulate the inferior pole branch and advanced the wire and catheter system into an ascending capsular branch which also appears to be filling this region. This artery was embolized using a 4 mm by 10 cm interlock coil. Microcatheter was again removed and a repeat splenic angiogram was performed from the main splenic artery the 035 catheter and no further extravasation identified no other definite targets identified. All catheters and sheath removed from the patient.  TR band applied. IMPRESSION: Splenic artery selective embolization as described above Electronically Signed   By: Susan Ensign   On: 05/13/2024 16:47   IR Angiogram Selective Each Additional Vessel Result Date:  05/13/2024 INDICATION: Splenic laceration in a patient with crush injury and multiple fractures. CT of the abdomen and pelvis performed on 05/13/2024 demonstrates active extravasation in the upper pole of the splenic parenchyma. EXAM: Mesenteric angiogram with splenic artery embolization, selective common multiple branches MEDICATIONS: . The antibiotic was administered within 1 hour of the procedure ANESTHESIA/SEDATION: Moderate (conscious) sedation was employed during this procedure. A total of Versed  1 mg and Fentanyl  75 mcg was administered intravenously by the radiology nurse. Total intra-service moderate Sedation Time: 59 minutes. The patient's level of consciousness and vital signs were monitored continuously by radiology nursing throughout the procedure under my direct supervision. CONTRAST:  One hundred twenty mL Omnipaque  300 FLUOROSCOPY: Radiation Exposure Index (as provided by the fluoroscopic device): 1,046 mGy Kerma COMPLICATIONS: None immediate. PROCEDURE: Informed consent was obtained from the patient following explanation of the procedure, risks, benefits and alternatives. The patient understands, agrees and consents for the procedure. All questions were addressed. A time out was performed prior to the initiation of the procedure. Maximal barrier sterile technique utilized including caps, mask, sterile gowns, sterile gloves, large sterile drape, hand hygiene, and Betadine prep. With patient in supine position and the left arm prepped draped in the usual sterile fashion, ultrasound was used to evaluate the left radial artery. The left radial artery was anechoic and pulsatile indicating patency. Skin was infiltrated with 1% lidocaine . The needle was advanced from the skin into the midpoint of the radial artery under ultrasound guidance. A final image was obtained of the arterial access and stored in the patient's permanent medical record. Access was exchanged for Bentson guidewire and with 3 5 catheter  through a radial artery sheath. Radial cocktail was administered through the side arm of the sheath. The guidewire and catheter were advanced to the subclavian artery and subsequently into the descending thoracic aorta under fluoroscopy guidance. With the catheter was below the diaphragm, the celiac trunk was selected and a celiac angiogram was performed. No active extravasation identified initially. Catheter and guidewire were then used to selectively cannulate the splenic artery. The catheter was advanced to the midpoint within the main splenic artery and repeat angiogram was performed. The superior pole is identified by not be perfused well with several branches feeding this region and abruptly terminating and appearing to be vasospastic. The splenic artery appears to be of a bifurcation tight branching pattern with a superior pole and an inferior pole artery the superior pole artery branches into a more widely distributed arterial branching system and at least 2 of the arterial branches are feeding the region  of the superior pole region the does not appear to be perfused corresponding with the active hemorrhage seen on CT. Coaxial technique was then used to navigate to the distal main splenic artery, advanced the microcatheter into the upper pole branch, and then advanced the microcatheter into the inferior aspect branch point and in this artery coil embolization was performed. And interlock 6 mm x 20 cm coil was deployed. Post coil embolization angiogram performed demonstrating satisfactory embolization of this branch. The catheter was then withdrawn into the superior pole artery and then used to selectively cannulate the next caudal branch point off the superior pole splenic artery. In this vessel a 4 mm x 10 cm interlock coil was deployed. Post embolization angiogram demonstrates satisfactory embolization of this branch. The microcatheter was then withdrawn and used to selectively cannulate the inferior pole  branch and advanced the wire and catheter system into an ascending capsular branch which also appears to be filling this region. This artery was embolized using a 4 mm by 10 cm interlock coil. Microcatheter was again removed and a repeat splenic angiogram was performed from the main splenic artery the 035 catheter and no further extravasation identified no other definite targets identified. All catheters and sheath removed from the patient.  TR band applied. IMPRESSION: Splenic artery selective embolization as described above Electronically Signed   By: Susan Ensign   On: 05/13/2024 16:47   DG CHEST PORT 1 VIEW Result Date: 05/13/2024 CLINICAL DATA:  [id number] Encounter for central line placement [id number] EXAM: PORTABLE CHEST - 1 VIEW COMPARISON:  May 13, 2024 FINDINGS: Left subclavian approach central venous catheter terminates in the upper SVC. Layering left pleural effusion with adjacent atelectasis. No pneumothorax. No cardiomegaly. Redemonstrated left-sided rib fractures, better visualized on the comparison CT. IMPRESSION: 1. Left subclavian approach central venous catheter terminates in the upper SVC. No pneumothorax. 2. Layering left pleural effusion with left basilar atelectasis. Electronically Signed   By: Rance Burrows M.D.   On: 05/13/2024 16:19   DG Tibia/Fibula Right Result Date: 05/13/2024 CLINICAL DATA:  Level L1 trauma EXAM: RIGHT TIBIA AND FIBULA - 2 VIEW COMPARISON:  None Available. FINDINGS: There is no evidence of fracture or other focal bone lesions. Soft tissues are unremarkable. IMPRESSION: Negative. Electronically Signed   By: Fredrich Jefferson M.D.   On: 05/13/2024 13:02     I have independently reviewed the above radiologic studies and discussed with the patient   Recent Lab Findings: Lab Results  Component Value Date   WBC 13.0 (H) 05/15/2024   HGB 9.3 (L) 05/15/2024   HCT 28.4 (L) 05/15/2024   PLT 127 (L) 05/15/2024   GLUCOSE 122 (H) 05/15/2024   ALT 139 (H) 05/13/2024    AST 160 (H) 05/13/2024   NA 132 (L) 05/15/2024   K 4.6 05/15/2024   CL 100 05/15/2024   CREATININE 1.17 05/15/2024   BUN 16 05/15/2024   CO2 24 05/15/2024   INR 1.2 05/13/2024   Assessment / Plan:      Traumatic Hemothorax- CT is in place... 2700 cc output since placement on 6/12 (currently level is at 760 cc)... CXR shows some atelectasis, residual fluid on left side  Plan: patient is stable, he is no longer requiring Levophed  for BP support.  CT output has been 2700 cc since placement (level currently at 760 cc), breathing without difficulty... Hemoglobin level is at 9.9.  Would recommended continued CT management.  If output does not resolve, he may need a VATS for exploration/drainage  of hemothorax... however at this current time surgical intervention is not required.  Dr. Sherene Dilling will follow with further recommendations.. CT management per Trauma   [name], PA-C 05/15/2024 12:35 PM    Chart reviewed, patient examined, agree with above.   He has multiple left sided rib fractures with a left hemothorax treated with chest tube. The output has been decreasing over the last 3 shifts ( 1340, 760, and 620). The drainage is thin, dark bloody. CXR this am showed good drainage with no significant collection of fluid in the pleural space. Hgb has trended down a little from 10.3 yesterday am to 9.3 this am. I would keep chest tube to suction and follow with expectation that this will continue to decrease. I suspect his pain along the left lower chest and epigastrium is likely neuropathic from the rib fractures with intercostal nerve injury. It follows a segmental course. I will followup on chest tube output tomorrow.  Bartley Lightning, MD.

## 2024-05-16 ENCOUNTER — Inpatient Hospital Stay (HOSPITAL_COMMUNITY): Payer: Worker's Compensation

## 2024-05-16 LAB — BASIC METABOLIC PANEL WITH GFR
Anion gap: 9 (ref 5–15)
BUN: 18 mg/dL (ref 6–20)
CO2: 25 mmol/L (ref 22–32)
Calcium: 7.8 mg/dL — ABNORMAL LOW (ref 8.9–10.3)
Chloride: 95 mmol/L — ABNORMAL LOW (ref 98–111)
Creatinine, Ser: 1.08 mg/dL (ref 0.61–1.24)
GFR, Estimated: >60 mL/min (ref >60)
Glucose, Bld: 111 mg/dL — ABNORMAL HIGH (ref 70–99)
Potassium: 4.1 mmol/L (ref 3.5–5.1)
Sodium: 129 mmol/L — ABNORMAL LOW (ref 135–145)

## 2024-05-16 LAB — CBC
HCT: 26.3 % — ABNORMAL LOW (ref 39.0–52.0)
Hemoglobin: 8.7 g/dL — ABNORMAL LOW (ref 13.0–17.0)
MCH: 27.9 pg (ref 26.0–34.0)
MCHC: 33.1 g/dL (ref 30.0–36.0)
MCV: 84.3 fL (ref 80.0–100.0)
Platelets: 144 10*3/uL — ABNORMAL LOW (ref 150–400)
RBC: 3.12 MIL/uL — ABNORMAL LOW (ref 4.22–5.81)
RDW: 14.3 % (ref 11.5–15.5)
WBC: 14.3 10*3/uL — ABNORMAL HIGH (ref 4.0–10.5)
nRBC: 0 % (ref 0.0–0.2)

## 2024-05-16 MED ORDER — SODIUM CHLORIDE 0.9 % IV BOLUS
1000.0000 mL | Freq: Once | INTRAVENOUS | Status: AC
  Administered 2024-05-16: 1000 mL via INTRAVENOUS

## 2024-05-16 NOTE — Progress Notes (Addendum)
      [address]       [place] [id number]             [phone]       Subjective:  Patient sitting up in bed.  Sleepy, but overall doing okay.  Objective: Vital signs in last 24 hours: Temp:  [97.3 F (36.3 C)-98.4 F (36.9 C)] 98.4 F (36.9 C) (06/15 0857) Pulse Rate:  [103-125] 111 (06/15 0800) Cardiac Rhythm: Sinus tachycardia (06/14 2000) Resp:  [9-21] 13 (06/15 0800) BP: (131-177)/(60-137) 163/89 (06/15 0800) SpO2:  [90 %-100 %] 98 % (06/15 0800)  Intake/Output from previous day: 06/14 0701 - 06/15 0700 In: 120 [P.O.:120] Out: 870 [Chest Tube:870] Intake/Output this shift: Total I/O In: 842.1 [IV Piggyback:842.1] Out: -   General appearance: alert, cooperative, and no distress Heart: regular rate and rhythm and tachy Lungs: diminished breath sounds left base  Lab Results: Recent Labs    05/15/24 0448 05/16/24 0430  WBC 13.0* 14.3*  HGB 9.3* 8.7*  HCT 28.4* 26.3*  PLT 127* 144*   BMET:  Recent Labs    05/15/24 0448 05/16/24 0430  NA 132* 129*  K 4.6 4.1  CL 100 95*  CO2 24 25  GLUCOSE 122* 111*  BUN 16 18  CREATININE 1.17 1.08  CALCIUM  7.9* 7.8*    PT/INR:  Recent Labs    05/13/24 1515  LABPROT 15.4*  INR 1.2   ABG    Component Value Date/Time   TCO2 21 (L) 05/13/2024 1131   CBG (last 3)  No results for input(s): GLUCAP in the last 72 hours.  Assessment/Plan:  Traumatic Hemothorax- CT remains in place, with serous blood tinged drainage.. since yesterday afternoon he has put out about 400 cc which continues to improve.chest X-ray with atelectasis, trace left pleural effusion  Continue chest tube per trauma.. no indication for surgery at this time   LOS: 3 days   [name], PA-C 05/16/2024   Chart reviewed, patient examined, agree with above.  Chest tube output continues to decrease is is thin serosanguinous today. CXR looks good. Would continue chest tube until minimal output to decrease the risk of  developing left pleural effusion.
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
## 2024-05-16 NOTE — Progress Notes (Signed)
 Inpatient Rehab Admissions:  Inpatient Rehab Consult received.  I met with patient and wife Velia Gess at the bedside for rehabilitation assessment and to discuss goals and expectations of an inpatient rehab admission.  Pt was lethargic but discussed average length of stay, insurance authorization process and discharge home after completion of CIR. Wife acknowledged understanding. She is in agreement that pt would benefit from therapy after d/c from acute care. She would like to discuss rehab options with pt when he is more alert prior to making a decision regarding CIR. Will follow up at later date.  Signed: Artemus Larsen, MS, CCC-SLP Admissions Coordinator [phone]

## 2024-05-17 DIAGNOSIS — J90 Pleural effusion, not elsewhere classified: Secondary | ICD-10-CM

## 2024-05-17 LAB — CBC
HCT: 24.7 % — ABNORMAL LOW (ref 39.0–52.0)
Hemoglobin: 8.1 g/dL — ABNORMAL LOW (ref 13.0–17.0)
MCH: 27.6 pg (ref 26.0–34.0)
MCHC: 32.8 g/dL (ref 30.0–36.0)
MCV: 84 fL (ref 80.0–100.0)
Platelets: 197 10*3/uL (ref 150–400)
RBC: 2.94 MIL/uL — ABNORMAL LOW (ref 4.22–5.81)
RDW: 14.6 % (ref 11.5–15.5)
WBC: 11 10*3/uL — ABNORMAL HIGH (ref 4.0–10.5)
nRBC: 0.4 % — ABNORMAL HIGH (ref 0.0–0.2)

## 2024-05-17 LAB — BASIC METABOLIC PANEL WITH GFR
Anion gap: 11 (ref 5–15)
BUN: 13 mg/dL (ref 6–20)
CO2: 25 mmol/L (ref 22–32)
Calcium: 7.9 mg/dL — ABNORMAL LOW (ref 8.9–10.3)
Chloride: 95 mmol/L — ABNORMAL LOW (ref 98–111)
Creatinine, Ser: 0.84 mg/dL (ref 0.61–1.24)
GFR, Estimated: >60 mL/min (ref >60)
Glucose, Bld: 106 mg/dL — ABNORMAL HIGH (ref 70–99)
Potassium: 3.5 mmol/L (ref 3.5–5.1)
Sodium: 131 mmol/L — ABNORMAL LOW (ref 135–145)

## 2024-05-17 MED ORDER — ENOXAPARIN SODIUM 30 MG/0.3ML IJ SOSY
30.0000 mg | PREFILLED_SYRINGE | Freq: Two times a day (BID) | INTRAMUSCULAR | Status: DC
  Administered 2024-05-17 – 2024-05-28 (×23): 30 mg via SUBCUTANEOUS
  Filled 2024-05-17 (×22): qty 0.3

## 2024-05-17 NOTE — Plan of Care (Signed)
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
## 2024-05-17 NOTE — Plan of Care (Signed)
   Problem: Education: Goal: Knowledge of General Education information will improve Description Including pain rating scale, medication(s)/side effects and non-pharmacologic comfort measures Outcome: Progressing   Problem: Health Behavior/Discharge Planning: Goal: Ability to manage health-related needs will improve Outcome: Progressing

## 2024-05-17 NOTE — Progress Notes (Signed)
 Inpatient Rehab Admissions Coordinator:  Saw pt and wife at bedside. Wife noted pt was lethargic. She mentioned that they had not yet discussed CIR. She would prefer if AC followed up on Tuesday. Will continue to follow.   Artemus Larsen, MS, CCC-SLP Admissions Coordinator [phone]

## 2024-05-18 ENCOUNTER — Inpatient Hospital Stay (HOSPITAL_COMMUNITY): Payer: Worker's Compensation

## 2024-05-18 LAB — CBC
HCT: 26.8 % — ABNORMAL LOW (ref 39.0–52.0)
Hemoglobin: 9.1 g/dL — ABNORMAL LOW (ref 13.0–17.0)
MCH: 28.4 pg (ref 26.0–34.0)
MCHC: 34 g/dL (ref 30.0–36.0)
MCV: 83.8 fL (ref 80.0–100.0)
Platelets: 275 10*3/uL (ref 150–400)
RBC: 3.2 MIL/uL — ABNORMAL LOW (ref 4.22–5.81)
RDW: 14.4 % (ref 11.5–15.5)
WBC: 11.2 10*3/uL — ABNORMAL HIGH (ref 4.0–10.5)
nRBC: 1.4 % — ABNORMAL HIGH (ref 0.0–0.2)

## 2024-05-18 LAB — BASIC METABOLIC PANEL WITH GFR
Anion gap: 11 (ref 5–15)
BUN: 14 mg/dL (ref 6–20)
CO2: 26 mmol/L (ref 22–32)
Calcium: 8.1 mg/dL — ABNORMAL LOW (ref 8.9–10.3)
Chloride: 94 mmol/L — ABNORMAL LOW (ref 98–111)
Creatinine, Ser: 0.83 mg/dL (ref 0.61–1.24)
GFR, Estimated: >60 mL/min (ref >60)
Glucose, Bld: 112 mg/dL — ABNORMAL HIGH (ref 70–99)
Potassium: 3.5 mmol/L (ref 3.5–5.1)
Sodium: 131 mmol/L — ABNORMAL LOW (ref 135–145)

## 2024-05-18 LAB — PHOSPHORUS: Phosphorus: 3.8 mg/dL (ref 2.5–4.6)

## 2024-05-18 LAB — MAGNESIUM: Magnesium: 2.3 mg/dL (ref 1.7–2.4)

## 2024-05-18 MED ORDER — BISACODYL 10 MG RE SUPP
10.0000 mg | Freq: Every day | RECTAL | Status: DC
  Administered 2024-05-18 – 2024-05-21 (×4): 10 mg via RECTAL
  Filled 2024-05-18 (×5): qty 1

## 2024-05-18 MED ORDER — SODIUM CHLORIDE 0.9 % IV SOLN: INTRAVENOUS | Status: AC

## 2024-05-18 NOTE — Progress Notes (Signed)
 PT Cancellation Note  Patient Details Name: Mike Miranda MRN: [id number] DOB: May 13, 1981   Cancelled Treatment:    Reason Eval/Treat Not Completed: Medical issues which prohibited therapy; patient declining PT until they can get his stomach issues figured out.  States it is swelled and they took an x-ray.  Encouraged him to reach out via RN if he decides he can participate later today. PT will follow up.   Marley Simmers 05/18/2024, 12:08 PM Abigail Hoff, PT Acute Rehabilitation Services Office:[phone] 05/18/2024

## 2024-05-18 NOTE — Progress Notes (Signed)
 PT Cancellation Note  Patient Details Name: Mike Miranda MRN: [id number] DOB: [birth year]   Cancelled Treatment:    Reason Eval/Treat Not Completed: Other (comment); per RN pt wants to wait on abdominal x-ray prior to PT.  Will follow up.   Marley Simmers 05/18/2024, 11:53 AM Abigail Hoff, PT Acute Rehabilitation Services Office:[phone] 05/18/2024

## 2024-05-18 NOTE — TOC Progression Note (Signed)
 Transition of Care [facility]) - Progression Note    Patient Details  Name: Mike Miranda MRN: [id number] Date of Birth: 1981/12/01  [facility] CM/SW Contact  Marianna Cid E Kimberlin Scheel, LCSW Phone Number: 05/18/2024, 2:00 PM  Clinical Narrative:    Plan for CIR.    Barriers to Discharge: Continued Medical Work up  Expected Discharge Plan and Services       Living arrangements for the past 2 months: Single Family Home                                       Social Determinants of Health (SDOH) Interventions SDOH Screenings   Tobacco Use: Low Risk  (05/13/2024)    Readmission Risk Interventions    05/14/2024   11:51 AM  Readmission Risk Prevention Plan  Post Dischage Appt Complete  Medication Screening Complete  Transportation Screening Complete

## 2024-05-18 NOTE — Plan of Care (Signed)
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
## 2024-05-18 NOTE — Plan of Care (Signed)

## 2024-05-18 NOTE — Progress Notes (Addendum)
  Inpatient Rehabilitation Admissions Coordinator   Met with patient and wife at bedside for rehab assessment. We discussed goals and expectations of a possible CIR admit. They prefer CIR for rehab. Family can provide expected caregiver support that is recommended . I will contact the workers Comp Case manager [address], to begin discussions phone [phone]. Please call me with any questions.   Jeannetta Millman, RN, MSN Rehab Admissions Coordinator [phone]  I spoke with CM with Workers Comp  [address] phone [phone] fax [phone]  Ardie Kras Barnhardt phone [phone]  With [facility] claim # [phone]  I will fax clinicals per their request

## 2024-05-19 LAB — CBC
HCT: 27.3 % — ABNORMAL LOW (ref 39.0–52.0)
Hemoglobin: 9.1 g/dL — ABNORMAL LOW (ref 13.0–17.0)
MCH: 27.9 pg (ref 26.0–34.0)
MCHC: 33.3 g/dL (ref 30.0–36.0)
MCV: 83.7 fL (ref 80.0–100.0)
Platelets: 340 10*3/uL (ref 150–400)
RBC: 3.26 MIL/uL — ABNORMAL LOW (ref 4.22–5.81)
RDW: 14.4 % (ref 11.5–15.5)
WBC: 11.3 10*3/uL — ABNORMAL HIGH (ref 4.0–10.5)
nRBC: 2.2 % — ABNORMAL HIGH (ref 0.0–0.2)

## 2024-05-19 LAB — BASIC METABOLIC PANEL WITH GFR
Anion gap: 12 (ref 5–15)
BUN: 14 mg/dL (ref 6–20)
CO2: 24 mmol/L (ref 22–32)
Calcium: 8 mg/dL — ABNORMAL LOW (ref 8.9–10.3)
Chloride: 94 mmol/L — ABNORMAL LOW (ref 98–111)
Creatinine, Ser: 0.84 mg/dL (ref 0.61–1.24)
GFR, Estimated: >60 mL/min (ref >60)
Glucose, Bld: 99 mg/dL (ref 70–99)
Potassium: 4.2 mmol/L (ref 3.5–5.1)
Sodium: 130 mmol/L — ABNORMAL LOW (ref 135–145)

## 2024-05-19 MED ORDER — SODIUM CHLORIDE 0.9 % IV SOLN: INTRAVENOUS | Status: AC
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
## 2024-05-19 NOTE — Plan of Care (Signed)
  Problem: Education: Goal: Knowledge of General Education information will improve Description: Including pain rating scale, medication(s)/side effects and non-pharmacologic comfort measures Outcome: Progressing   Problem: Clinical Measurements: Goal: Respiratory complications will improve Outcome: Progressing   Problem: Nutrition: Goal: Adequate nutrition will be maintained Outcome: Progressing   Problem: Elimination: Goal: Will not experience complications related to urinary retention Outcome: Progressing   Problem: Pain Managment: Goal: General experience of comfort will improve and/or be controlled Outcome: Progressing   Problem: Safety: Goal: Ability to remain free from injury will improve Outcome: Progressing   Problem: Activity: Goal: Ability to return to baseline activity level will improve Outcome: Progressing

## 2024-05-20 ENCOUNTER — Inpatient Hospital Stay (HOSPITAL_COMMUNITY): Payer: Self-pay

## 2024-05-20 LAB — BASIC METABOLIC PANEL WITH GFR
Anion gap: 7 (ref 5–15)
BUN: 10 mg/dL (ref 6–20)
CO2: 28 mmol/L (ref 22–32)
Calcium: 7.6 mg/dL — ABNORMAL LOW (ref 8.9–10.3)
Chloride: 96 mmol/L — ABNORMAL LOW (ref 98–111)
Creatinine, Ser: 0.76 mg/dL (ref 0.61–1.24)
GFR, Estimated: >60 mL/min (ref >60)
Glucose, Bld: 100 mg/dL — ABNORMAL HIGH (ref 70–99)
Potassium: 3 mmol/L — ABNORMAL LOW (ref 3.5–5.1)
Sodium: 131 mmol/L — ABNORMAL LOW (ref 135–145)

## 2024-05-20 LAB — CBC
HCT: 24.9 % — ABNORMAL LOW (ref 39.0–52.0)
Hemoglobin: 8.2 g/dL — ABNORMAL LOW (ref 13.0–17.0)
MCH: 27.9 pg (ref 26.0–34.0)
MCHC: 32.9 g/dL (ref 30.0–36.0)
MCV: 84.7 fL (ref 80.0–100.0)
Platelets: 388 10*3/uL (ref 150–400)
RBC: 2.94 MIL/uL — ABNORMAL LOW (ref 4.22–5.81)
RDW: 14.8 % (ref 11.5–15.5)
WBC: 10.3 10*3/uL (ref 4.0–10.5)
nRBC: 1.7 % — ABNORMAL HIGH (ref 0.0–0.2)

## 2024-05-20 LAB — MAGNESIUM: Magnesium: 2.1 mg/dL (ref 1.7–2.4)

## 2024-05-20 MED ORDER — POTASSIUM CHLORIDE 20 MEQ PO PACK: 40.0000 meq | PACK | Freq: Two times a day (BID) | ORAL | Status: DC

## 2024-05-20 MED ORDER — POTASSIUM CHLORIDE 10 MEQ/100ML IV SOLN
10.0000 meq | INTRAVENOUS | Status: AC
  Administered 2024-05-20 (×4): 10 meq via INTRAVENOUS
  Filled 2024-05-20 (×4): qty 100

## 2024-05-20 MED ORDER — POTASSIUM CHLORIDE 20 MEQ PO PACK
40.0000 meq | PACK | Freq: Once | ORAL | Status: AC
  Administered 2024-05-20: 40 meq via ORAL
  Filled 2024-05-20: qty 2
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
## 2024-05-20 NOTE — Plan of Care (Signed)
  Problem: Education: Goal: Knowledge of General Education information will improve Description: Including pain rating scale, medication(s)/side effects and non-pharmacologic comfort measures Outcome: Not Progressing   Problem: Health Behavior/Discharge Planning: Goal: Ability to manage health-related needs will improve Outcome: Not Progressing   Problem: Clinical Measurements: Goal: Ability to maintain clinical measurements within normal limits will improve Outcome: Not Progressing Goal: Will remain free from infection Outcome: Not Progressing Goal: Diagnostic test results will improve Outcome: Not Progressing Goal: Respiratory complications will improve Outcome: Not Progressing Goal: Cardiovascular complication will be avoided Outcome: Not Progressing   Problem: Activity: Goal: Risk for activity intolerance will decrease Outcome: Not Progressing   Problem: Nutrition: Goal: Adequate nutrition will be maintained Outcome: Not Progressing   Problem: Coping: Goal: Level of anxiety will decrease Outcome: Not Progressing   Problem: Elimination: Goal: Will not experience complications related to bowel motility Outcome: Not Progressing Goal: Will not experience complications related to urinary retention Outcome: Not Progressing   Problem: Pain Managment: Goal: General experience of comfort will improve and/or be controlled Outcome: Not Progressing   Problem: Safety: Goal: Ability to remain free from injury will improve Outcome: Not Progressing   Problem: Skin Integrity: Goal: Risk for impaired skin integrity will decrease Outcome: Not Progressing   Problem: Education: Goal: Understanding of CV disease, CV risk reduction, and recovery process will improve Outcome: Not Progressing Goal: Individualized Educational Video(s) Outcome: Not Progressing   Problem: Activity: Goal: Ability to return to baseline activity level will improve Outcome: Not Progressing   Problem:  Cardiovascular: Goal: Ability to achieve and maintain adequate cardiovascular perfusion will improve Outcome: Not Progressing Goal: Vascular access site(s) Level 0-1 will be maintained Outcome: Not Progressing   Problem: Health Behavior/Discharge Planning: Goal: Ability to safely manage health-related needs after discharge will improve Outcome: Not Progressing

## 2024-05-20 NOTE — Progress Notes (Signed)
  Inpatient Rehabilitation Admissions Coordinator   I left voicemail for workers comp [employer] phone [phone] , to discuss eventual AIR placement as well as emailed CM, Christal Court, of need to follow up. I met with patient and wife at bedside to update them on process.  Jeannetta Millman, RN, MSN Rehab Admissions Coordinator [phone] 05/20/2024 2:58 PM

## 2024-05-20 NOTE — Plan of Care (Signed)
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
## 2024-05-20 NOTE — Progress Notes (Signed)
 Subclavian central line removed per order. Prior to removal, dried drainage noted around central line site, dried drainage cleansed with iodine prior to removal. Central line catheter tip intact. Pt tolerated procedure well with no complications. No drainage noted upon removal. Sterile vaseline gauze with transparent drsg applied.

## 2024-05-21 ENCOUNTER — Other Ambulatory Visit: Payer: Self-pay

## 2024-05-21 LAB — BASIC METABOLIC PANEL WITH GFR
Anion gap: 8 (ref 5–15)
BUN: 11 mg/dL (ref 6–20)
CO2: 28 mmol/L (ref 22–32)
Calcium: 8.4 mg/dL — ABNORMAL LOW (ref 8.9–10.3)
Chloride: 97 mmol/L — ABNORMAL LOW (ref 98–111)
Creatinine, Ser: 0.75 mg/dL (ref 0.61–1.24)
GFR, Estimated: >60 mL/min (ref >60)
Glucose, Bld: 107 mg/dL — ABNORMAL HIGH (ref 70–99)
Potassium: 3.7 mmol/L (ref 3.5–5.1)
Sodium: 133 mmol/L — ABNORMAL LOW (ref 135–145)

## 2024-05-21 LAB — CBC
HCT: 26.5 % — ABNORMAL LOW (ref 39.0–52.0)
Hemoglobin: 8.8 g/dL — ABNORMAL LOW (ref 13.0–17.0)
MCH: 28.1 pg (ref 26.0–34.0)
MCHC: 33.2 g/dL (ref 30.0–36.0)
MCV: 84.7 fL (ref 80.0–100.0)
Platelets: 522 10*3/uL — ABNORMAL HIGH (ref 150–400)
RBC: 3.13 MIL/uL — ABNORMAL LOW (ref 4.22–5.81)
RDW: 15.4 % (ref 11.5–15.5)
WBC: 9.8 10*3/uL (ref 4.0–10.5)
nRBC: 1.3 % — ABNORMAL HIGH (ref 0.0–0.2)

## 2024-05-21 LAB — PHOSPHORUS: Phosphorus: 4.4 mg/dL (ref 2.5–4.6)

## 2024-05-21 MED ORDER — BISACODYL 10 MG RE SUPP: 10.0000 mg | Freq: Two times a day (BID) | RECTAL | Status: DC | PRN

## 2024-05-21 MED ORDER — METOCLOPRAMIDE HCL 5 MG/ML IJ SOLN
5.0000 mg | Freq: Four times a day (QID) | INTRAMUSCULAR | Status: AC
  Administered 2024-05-21 – 2024-05-24 (×12): 5 mg via INTRAVENOUS
  Filled 2024-05-21 (×12): qty 2

## 2024-05-21 NOTE — Plan of Care (Signed)
  Problem: Education: Goal: Knowledge of General Education information will improve Description: Including pain rating scale, medication(s)/side effects and non-pharmacologic comfort measures Outcome: Progressing   Problem: Activity: Goal: Risk for activity intolerance will decrease Outcome: Progressing   Problem: Elimination: Goal: Will not experience complications related to urinary retention Outcome: Progressing   Problem: Safety: Goal: Ability to remain free from injury will improve Outcome: Progressing

## 2024-05-21 NOTE — Progress Notes (Addendum)
 At bedside for PICC placement. Informed pt and pt wife on rationale for PICC, risks and benefits. Pt stated he is currently eating a full liquid diet, and is tolerating well with no issue. Pt refused placement at this time. MD and primary RN aware.
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
## 2024-05-21 NOTE — Progress Notes (Signed)
 OT Cancellation Note  Patient Details Name: Mike Miranda MRN: [id number] DOB: May 27, 1981   Cancelled Treatment:    Reason Eval/Treat Not Completed: Patient declined, no reason specified (Pt polietly declined stating he feels like he over did it with PT although he said it did feel good to walk. OT to follow for treatment as able.)  Starla Easterly 05/21/2024, 2:43 PM

## 2024-05-22 LAB — CBC
HCT: 31.7 % — ABNORMAL LOW (ref 39.0–52.0)
Hemoglobin: 10.3 g/dL — ABNORMAL LOW (ref 13.0–17.0)
MCH: 27.7 pg (ref 26.0–34.0)
MCHC: 32.5 g/dL (ref 30.0–36.0)
MCV: 85.2 fL (ref 80.0–100.0)
Platelets: 733 10*3/uL — ABNORMAL HIGH (ref 150–400)
RBC: 3.72 MIL/uL — ABNORMAL LOW (ref 4.22–5.81)
RDW: 16.1 % — ABNORMAL HIGH (ref 11.5–15.5)
WBC: 11.9 10*3/uL — ABNORMAL HIGH (ref 4.0–10.5)
nRBC: 0.8 % — ABNORMAL HIGH (ref 0.0–0.2)

## 2024-05-22 LAB — BASIC METABOLIC PANEL WITH GFR
Anion gap: 13 (ref 5–15)
BUN: 11 mg/dL (ref 6–20)
CO2: 22 mmol/L (ref 22–32)
Calcium: 8.6 mg/dL — ABNORMAL LOW (ref 8.9–10.3)
Chloride: 99 mmol/L (ref 98–111)
Creatinine, Ser: 0.79 mg/dL (ref 0.61–1.24)
GFR, Estimated: >60 mL/min (ref >60)
Glucose, Bld: 124 mg/dL — ABNORMAL HIGH (ref 70–99)
Potassium: 4 mmol/L (ref 3.5–5.1)
Sodium: 134 mmol/L — ABNORMAL LOW (ref 135–145)

## 2024-05-22 MED ORDER — ENSURE PLUS HIGH PROTEIN PO LIQD
237.0000 mL | Freq: Two times a day (BID) | ORAL | Status: DC
  Administered 2024-05-22 – 2024-05-28 (×9): 237 mL via ORAL

## 2024-05-22 MED ORDER — FUROSEMIDE 10 MG/ML IJ SOLN
20.0000 mg | Freq: Once | INTRAMUSCULAR | Status: AC
  Administered 2024-05-22: 20 mg via INTRAVENOUS
  Filled 2024-05-22: qty 2

## 2024-05-22 NOTE — Plan of Care (Signed)
  Problem: Education: Goal: Knowledge of General Education information will improve Description: Including pain rating scale, medication(s)/side effects and non-pharmacologic comfort measures Outcome: Not Progressing   Problem: Health Behavior/Discharge Planning: Goal: Ability to manage health-related needs will improve Outcome: Not Progressing   Problem: Clinical Measurements: Goal: Ability to maintain clinical measurements within normal limits will improve Outcome: Not Progressing Goal: Will remain free from infection Outcome: Not Progressing Goal: Diagnostic test results will improve Outcome: Not Progressing Goal: Respiratory complications will improve Outcome: Not Progressing Goal: Cardiovascular complication will be avoided Outcome: Not Progressing   Problem: Activity: Goal: Risk for activity intolerance will decrease Outcome: Not Progressing   Problem: Nutrition: Goal: Adequate nutrition will be maintained Outcome: Not Progressing   Problem: Coping: Goal: Level of anxiety will decrease Outcome: Not Progressing   Problem: Elimination: Goal: Will not experience complications related to bowel motility Outcome: Not Progressing Goal: Will not experience complications related to urinary retention Outcome: Not Progressing   Problem: Pain Managment: Goal: General experience of comfort will improve and/or be controlled Outcome: Not Progressing   Problem: Safety: Goal: Ability to remain free from injury will improve Outcome: Not Progressing   Problem: Skin Integrity: Goal: Risk for impaired skin integrity will decrease Outcome: Not Progressing   Problem: Education: Goal: Understanding of CV disease, CV risk reduction, and recovery process will improve Outcome: Not Progressing Goal: Individualized Educational Video(s) Outcome: Not Progressing   Problem: Activity: Goal: Ability to return to baseline activity level will improve Outcome: Not Progressing   Problem:  Cardiovascular: Goal: Ability to achieve and maintain adequate cardiovascular perfusion will improve Outcome: Not Progressing Goal: Vascular access site(s) Level 0-1 will be maintained Outcome: Not Progressing   Problem: Health Behavior/Discharge Planning: Goal: Ability to safely manage health-related needs after discharge will improve Outcome: Not Progressing
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
## 2024-05-23 ENCOUNTER — Inpatient Hospital Stay (HOSPITAL_COMMUNITY): Payer: Self-pay

## 2024-05-23 LAB — BASIC METABOLIC PANEL WITH GFR
Anion gap: 12 (ref 5–15)
BUN: 13 mg/dL (ref 6–20)
CO2: 25 mmol/L (ref 22–32)
Calcium: 8.8 mg/dL — ABNORMAL LOW (ref 8.9–10.3)
Chloride: 98 mmol/L (ref 98–111)
Creatinine, Ser: 0.86 mg/dL (ref 0.61–1.24)
GFR, Estimated: >60 mL/min (ref >60)
Glucose, Bld: 104 mg/dL — ABNORMAL HIGH (ref 70–99)
Potassium: 3.9 mmol/L (ref 3.5–5.1)
Sodium: 135 mmol/L (ref 135–145)

## 2024-05-23 LAB — CBC
HCT: 27.7 % — ABNORMAL LOW (ref 39.0–52.0)
Hemoglobin: 9 g/dL — ABNORMAL LOW (ref 13.0–17.0)
MCH: 28.1 pg (ref 26.0–34.0)
MCHC: 32.5 g/dL (ref 30.0–36.0)
MCV: 86.6 fL (ref 80.0–100.0)
Platelets: 775 10*3/uL — ABNORMAL HIGH (ref 150–400)
RBC: 3.2 MIL/uL — ABNORMAL LOW (ref 4.22–5.81)
RDW: 16.4 % — ABNORMAL HIGH (ref 11.5–15.5)
WBC: 12.5 10*3/uL — ABNORMAL HIGH (ref 4.0–10.5)
nRBC: 0.8 % — ABNORMAL HIGH (ref 0.0–0.2)

## 2024-05-23 NOTE — Plan of Care (Signed)
  Problem: Education: Goal: Knowledge of General Education information will improve Description: Including pain rating scale, medication(s)/side effects and non-pharmacologic comfort measures Outcome: Not Progressing   Problem: Health Behavior/Discharge Planning: Goal: Ability to manage health-related needs will improve Outcome: Not Progressing   Problem: Clinical Measurements: Goal: Ability to maintain clinical measurements within normal limits will improve Outcome: Not Progressing Goal: Will remain free from infection Outcome: Not Progressing Goal: Diagnostic test results will improve Outcome: Not Progressing Goal: Respiratory complications will improve Outcome: Not Progressing Goal: Cardiovascular complication will be avoided Outcome: Not Progressing   Problem: Activity: Goal: Risk for activity intolerance will decrease Outcome: Not Progressing   Problem: Nutrition: Goal: Adequate nutrition will be maintained Outcome: Not Progressing   Problem: Coping: Goal: Level of anxiety will decrease Outcome: Not Progressing   Problem: Elimination: Goal: Will not experience complications related to bowel motility Outcome: Not Progressing Goal: Will not experience complications related to urinary retention Outcome: Not Progressing   Problem: Pain Managment: Goal: General experience of comfort will improve and/or be controlled Outcome: Not Progressing   Problem: Safety: Goal: Ability to remain free from injury will improve Outcome: Not Progressing   Problem: Skin Integrity: Goal: Risk for impaired skin integrity will decrease Outcome: Not Progressing   Problem: Education: Goal: Understanding of CV disease, CV risk reduction, and recovery process will improve Outcome: Not Progressing Goal: Individualized Educational Video(s) Outcome: Not Progressing   Problem: Activity: Goal: Ability to return to baseline activity level will improve Outcome: Not Progressing   Problem:  Cardiovascular: Goal: Ability to achieve and maintain adequate cardiovascular perfusion will improve Outcome: Not Progressing Goal: Vascular access site(s) Level 0-1 will be maintained Outcome: Not Progressing   Problem: Health Behavior/Discharge Planning: Goal: Ability to safely manage health-related needs after discharge will improve Outcome: Not Progressing
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
## 2024-05-23 NOTE — Plan of Care (Signed)
 Alert and oriented.  Vitals stable.  Ambulatory in hallway with rolling walker.  Chest tube with serosanguinous output.    Problem: Education: Goal: Knowledge of General Education information will improve Description: Including pain rating scale, medication(s)/side effects and non-pharmacologic comfort measures Outcome: Progressing   Problem: Health Behavior/Discharge Planning: Goal: Ability to manage health-related needs will improve Outcome: Progressing   Problem: Clinical Measurements: Goal: Ability to maintain clinical measurements within normal limits will improve Outcome: Progressing Goal: Will remain free from infection Outcome: Progressing

## 2024-05-24 ENCOUNTER — Inpatient Hospital Stay (HOSPITAL_COMMUNITY): Payer: Self-pay

## 2024-05-24 DIAGNOSIS — M7989 Other specified soft tissue disorders: Secondary | ICD-10-CM

## 2024-05-24 LAB — CBC
HCT: 28.8 % — ABNORMAL LOW (ref 39.0–52.0)
Hemoglobin: 9.3 g/dL — ABNORMAL LOW (ref 13.0–17.0)
MCH: 28 pg (ref 26.0–34.0)
MCHC: 32.3 g/dL (ref 30.0–36.0)
MCV: 86.7 fL (ref 80.0–100.0)
Platelets: 831 10*3/uL — ABNORMAL HIGH (ref 150–400)
RBC: 3.32 MIL/uL — ABNORMAL LOW (ref 4.22–5.81)
RDW: 16.6 % — ABNORMAL HIGH (ref 11.5–15.5)
WBC: 9.9 10*3/uL (ref 4.0–10.5)
nRBC: 0.5 % — ABNORMAL HIGH (ref 0.0–0.2)

## 2024-05-24 MED ORDER — FUROSEMIDE 10 MG/ML IJ SOLN
40.0000 mg | Freq: Once | INTRAMUSCULAR | Status: AC
  Administered 2024-05-24: 40 mg via INTRAVENOUS
  Filled 2024-05-24: qty 4
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
## 2024-05-24 NOTE — Progress Notes (Signed)
 Inpatient Rehab Admissions Coordinator:   Met with patient at bedside to give update. Worker's comp is still being negotiated at this time. Continue to follow for potential admission to CIR pending insurance decision.   Rehab Admissons Coordinator Barnett Elzey, [place], [place] [phone]
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
## 2024-05-24 NOTE — Plan of Care (Signed)
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
## 2024-05-25 ENCOUNTER — Inpatient Hospital Stay (HOSPITAL_COMMUNITY): Payer: Self-pay

## 2024-05-25 LAB — CBC
HCT: 26.3 % — ABNORMAL LOW (ref 39.0–52.0)
Hemoglobin: 8.6 g/dL — ABNORMAL LOW (ref 13.0–17.0)
MCH: 28.1 pg (ref 26.0–34.0)
MCHC: 32.7 g/dL (ref 30.0–36.0)
MCV: 85.9 fL (ref 80.0–100.0)
Platelets: 833 10*3/uL — ABNORMAL HIGH (ref 150–400)
RBC: 3.06 MIL/uL — ABNORMAL LOW (ref 4.22–5.81)
RDW: 16.8 % — ABNORMAL HIGH (ref 11.5–15.5)
WBC: 10.4 10*3/uL (ref 4.0–10.5)
nRBC: 0.8 % — ABNORMAL HIGH (ref 0.0–0.2)

## 2024-05-25 MED ORDER — TRAMADOL HCL 50 MG PO TABS
100.0000 mg | ORAL_TABLET | Freq: Four times a day (QID) | ORAL | Status: DC
  Administered 2024-05-25 – 2024-05-28 (×13): 100 mg via ORAL
  Filled 2024-05-25 (×13): qty 2

## 2024-05-25 MED ORDER — OXYCODONE HCL 5 MG PO TABS
10.0000 mg | ORAL_TABLET | ORAL | Status: DC | PRN
  Administered 2024-05-25: 10 mg via ORAL
  Administered 2024-05-26: 15 mg via ORAL
  Filled 2024-05-25: qty 2
  Filled 2024-05-25: qty 3

## 2024-05-25 NOTE — Progress Notes (Signed)
 Inpatient Rehab Admissions Coordinator:    CIR following. We continue to work on getting a single case agreement in place with Pt.'s woker's comp.   Leita Kleine, MS, CCC-SLP Rehab Admissions Coordinator  [phone] (celll) [phone] (office)
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
## 2024-05-26 ENCOUNTER — Inpatient Hospital Stay (HOSPITAL_COMMUNITY): Payer: Self-pay

## 2024-05-26 LAB — CBC
HCT: 26.9 % — ABNORMAL LOW (ref 39.0–52.0)
Hemoglobin: 8.5 g/dL — ABNORMAL LOW (ref 13.0–17.0)
MCH: 27.6 pg (ref 26.0–34.0)
MCHC: 31.6 g/dL (ref 30.0–36.0)
MCV: 87.3 fL (ref 80.0–100.0)
Platelets: 825 10*3/uL — ABNORMAL HIGH (ref 150–400)
RBC: 3.08 MIL/uL — ABNORMAL LOW (ref 4.22–5.81)
RDW: 16.9 % — ABNORMAL HIGH (ref 11.5–15.5)
WBC: 10.3 10*3/uL (ref 4.0–10.5)
nRBC: 0.7 % — ABNORMAL HIGH (ref 0.0–0.2)

## 2024-05-26 MED ORDER — HYDROMORPHONE HCL 1 MG/ML IJ SOLN: 0.2500 mg | Freq: Four times a day (QID) | INTRAMUSCULAR | Status: DC | PRN

## 2024-05-26 NOTE — Progress Notes (Signed)
 PT Cancellation Note  Patient Details Name: Mike Miranda MRN: [id number] DOB: Mar 19, 1981   Cancelled Treatment:    Reason Eval/Treat Not Completed: Patient declined, no reason specified. Attempted x2 this morning, on initial attempt, pt eating and asked PT to return, on second attempt pt on phone and asked PT to return. Will continue to make attempts through day.   [name], PT, DPT   Acute Rehabilitation Department Office [phone] Secure Chat Communication Preferred   Izetta JULIANNA Call 05/26/2024, 9:12 AM

## 2024-05-26 NOTE — Progress Notes (Addendum)
 Inpatient Rehabilitation Admissions Coordinator   I continue to await medical progress as well as work with workers compensation concerning possible admit to CIR.  Heron Leavell, RN, MSN Rehab Admissions Coordinator [phone] 05/26/2024 4:12 PM   Patient has progressed well and is no longer in need of CIR level rehab. I spoke with both he and his wife by phone and they are in agreement. I updated the RN CM, Meg at [phone] as well as adjuster, Jacquelyn Barnhart at [phone]. [facility] RN CM to do onsite visit in the am.We will sign off. TOC and acute team made aware.  Heron Leavell, RN, MSN Rehab Admissions Coordinator [phone] 05/26/2024 4:55 PM
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
## 2024-05-26 NOTE — Plan of Care (Signed)
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
## 2024-05-27 ENCOUNTER — Inpatient Hospital Stay (HOSPITAL_COMMUNITY): Payer: Self-pay

## 2024-05-27 NOTE — TOC Progression Note (Addendum)
 Transition of Care [facility]) - Progression Note    Patient Details  Name: Hrishikesh Hoeg MRN: [id number] Date of Birth: [birth year]  Transition of Care [facility]) CM/SW Contact  Darrious Youman E Mattison Stuckey, LCSW Phone Number: 05/27/2024, 9:55 AM  Clinical Narrative:    Workers Comp Rep [employer] [phone]) will work on [employer] and DME set up. Asked PA/MD to place orders.  Met with patient and spouse at bedside. They confirm plan for home with [facility] and DME arranged by Dorina.  Wife requests FMLA paperwork be filled out.  3:40- Completed FMLA paperwork given at bedside.    Barriers to Discharge: Continued Medical Work up  Expected Discharge Plan and Services       Living arrangements for the past 2 months: Single Family Home                                       Social Determinants of Health (SDOH) Interventions SDOH Screenings   Tobacco Use: Low Risk  (05/13/2024)    Readmission Risk Interventions    05/14/2024   11:51 AM  Readmission Risk Prevention Plan  Post Dischage Appt Complete  Medication Screening Complete  Transportation Screening Complete
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
## 2024-05-27 NOTE — TOC Progression Note (Addendum)
 Transition of Care (TOC) - Progression Note  Rayfield Gobble RN,BSN Transitions of Care Unit 4NP (Non Trauma)- RN Case Manager See Treatment Team for direct Phone #   Patient Details  Name: Mike Miranda MRN: [id number] Date of Birth: 11/01/1981  Transition of Care [facility]) CM/SW Contact  Gobble Rayfield Hurst, RN Phone Number: 05/27/2024, 12:46 PM  Clinical Narrative:    CM received call from Dorina Ly CM w/ [facility] who is now following for [facility] and discharge planning needs. He is currently at bedside meeting with pt and wife.  Per Dorina he will arrange [facility] and DME needs once he receives orders. He is also requesting updated clinicals from 6/23 and forward.   M[place]'s contact info: Dorina Ly- [facility] [phone] Fax- [phone] Email- [email or web address]  Clinicals and HH orders faxed as per request.  CM will fax DME orders once placed (Per PT only DME need is single point cane)  Dorina will also need d/c summary faxed once available.   Family to transport home- TOC to follow   1405- DME order for cane faxed to [facility]- Dorina Ly who will order   Expected Discharge Plan: IP Rehab Facility Barriers to Discharge: Continued Medical Work up  Expected Discharge Plan and Services In-house Referral: Clinical Social Work Discharge Planning Services: [employer] Consult Post Acute Care Choice: Horticulturist, commercial, Home Health (per workers comp) Living arrangements for the past 2 months: Single Family Home                 DME Arranged: Air traffic controller spoke with at DME Agency: Per Workers Comp- arrangements HH Arranged: PT, OT       Representative spoke with at [facility] Agency: Per Workers Comp CM arrangements   Social Determinants of Health (SDOH) Interventions SDOH Screenings   Tobacco Use: Low Risk  (05/13/2024)    Readmission Risk Interventions    05/14/2024   11:51 AM  Readmission Risk Prevention Plan  Post Dischage Appt  Complete  Medication Screening Complete  Transportation Screening Complete

## 2024-05-27 NOTE — Progress Notes (Signed)
 Physical Therapy Treatment Patient Details Name: Mike Miranda MRN: [id number] DOB: 02-17-1981 Today's Date: 05/27/2024   History of Present Illness Pt is a 43 y.o. 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 Pt sustained a grade 2 medial spleen injury with small area of active extravasation, multiple L rib fx with small HPTX, L adrenal hematoma, small R renal infarct, T9-L1 spinous process fxs, multiple lumbar TVP fxs, and T spine compression fxs. S/p splenic artery embolization 6/12. Chest tube placed 6/13. PMH: HTN, seizures    PT Comments  The pt is making great functional progress as he was able to progress away from relying on the RW to ambulate to being able to ambulate steadily without UE support. He was also able to navigate x5 stairs with x1 handrail support without LOB or physical assistance. CGA-supervision was provided for safety with all functional mobility. The pt expressed concern about how he would mobilize on bad days or when his R knee was being bothersome. Thus, practiced ambulating with a SPC and a QC, with the pt reporting desire to have a SPC at d/c to use as needed to manage his pain and improve his mobility safety on bad days. Updated DME recs to reflect this. He already had a 3in1, RW, and shower chair at home. Educated pt and wife on continued IS use, ambulating to tolerance x3 longer walks/day, use of 3in1, use of shower chair as needed, changing positions frequently, progressing away from AD as able with wife guarding pt (with gait belt provided) for safety until no longer needed, and elevating legs and performing ankle pumps to manage edema. They verbalized understanding of all education and reported no further questions at this time. Based on his great functional progress, updated d/c recs to HHPT. Will continue to follow  acutely.    If plan is discharge home, recommend the following: Assistance with cooking/housework;Assist for transportation;Help with stairs or ramp for entrance;A little help with bathing/dressing/bathroom   Can travel by private vehicle        Equipment Recommendations  [place] (single point)    Recommendations for Other Services       Precautions / Restrictions Precautions Precautions: Fall;Back Precaution Booklet Issued: Yes (comment) Recall of Precautions/Restrictions: Intact Precaution/Restrictions Comments: reviewed spinal precautions; L chest tube Required Braces or Orthoses: Spinal Brace Spinal Brace: Thoracolumbosacral orthotic;Applied in sitting position Restrictions Weight Bearing Restrictions Per Provider Order: No     Mobility  Bed Mobility Overal bed mobility: Needs Assistance Bed Mobility: Rolling, Sidelying to Sit Rolling: Used rails, Supervision Sidelying to sit: HOB elevated, Used rails, Supervision       General bed mobility comments: Supervision for safety, pt using rails with HOB elevated to simulate home set-up. Pt complied to spinal precautions throughout    Transfers Overall transfer level: Needs assistance Equipment used: Rolling walker (2 wheels) Transfers: Sit to/from Stand Sit to Stand: Supervision           General transfer comment: Pt able to come to stand from lowest bed height without LOB or assistance, supervision for safety    Ambulation/Gait Ambulation/Gait assistance: Supervision Gait Distance (Feet): 255 Feet (x2 bouts of ~255 ft > ~240 ft) Assistive device: Rolling walker (2 wheels), None, Straight cane, Quad cane Gait Pattern/deviations: Decreased step length - right, Decreased step length - left, Decreased stride length, Trunk flexed,  Step-through pattern Gait velocity: slightly reduced Gait velocity interpretation: >2.62 ft/sec, indicative of community ambulatory   General Gait Details: Pt ambulates steadily with RW, but  tends to lean anteriorly onto hands on RW more than necessary. When RW is removed, he stands more upright and displays a steady gait. Pt preferred to try a cane to utilize when his pain in his R knee or L flank is bothersome. Educated pt on sequencing cane with his feet. Pt preferred the [facility] over the quad cane and preferred to place the cane in his R hand rather than his L. Fair carryover of sequencing feet with cane, intermittently ambulating quickly and unable to keep up with cane. Pt needed cues to slow down to get proper sequencing with the cane. Educated pt and his wife on pt utilizing a RW outside and progressing away from RW and cane as able with wife guarding him with gait belt provided. They verbalized understanding   Stairs Stairs: Yes Stairs assistance: Contact guard assist Stair Management: One rail Right, One rail Left, Alternating pattern, Forwards Number of Stairs: 5 General stair comments: Ascends with R rail and descends with L rail to simulate home set-up. Pt navigates stairs quickly with reciprocal pattern and no LOB, CGA for safety   Wheelchair Mobility     Tilt Bed    Modified Rankin (Stroke Patients Only)       Balance Overall balance assessment: Needs assistance Sitting-balance support: Feet supported, No upper extremity supported Sitting balance-Leahy Scale: Good Sitting balance - Comments: able to donn brace independently while sitting EOB   Standing balance support: Bilateral upper extremity supported, During functional activity, Reliant on assistive device for balance, No upper extremity supported, Single extremity supported Standing balance-Leahy Scale: Good Standing balance comment: able to stand and ambulate without UE support or LOB, but prefers RW vs cane for pain management intermittently                            Communication Communication Communication: No apparent difficulties  Cognition Arousal: Alert Behavior During Therapy: WFL  for tasks assessed/performed   PT - Cognitive impairments: No apparent impairments                         Following commands: Intact      Cueing Cueing Techniques: Verbal cues  Exercises      General Comments General comments (skin integrity, edema, etc.): educated pt and wife on continued IS use, ambulating to tolerance x3 longer walks/day, use of 3in1, use of shower chair as needed, changing positions frequently, progressing away from AD as able with wife guarding pt for safety until no longer needed, and elevating legs and performing ankle pumps to manage edema. They verbalized understanding      Pertinent Vitals/Pain Pain Assessment Pain Assessment: Faces Faces Pain Scale: Hurts a little bit Pain Location: L flank Pain Descriptors / Indicators: Discomfort, Grimacing, Guarding Pain Intervention(s): Limited activity within patient's tolerance, Monitored during session, Repositioned    Home Living                          Prior Function            PT Goals (current goals can now be found in the care plan section) Acute Rehab PT Goals Patient Stated Goal: to reduce pain PT Goal Formulation: With patient/family Time For Goal Achievement: 05/28/24 Potential  to Achieve Goals: Good Progress towards PT goals: Progressing toward goals    Frequency    Min 2X/week      PT Plan      Co-evaluation              AM-PAC PT 6 Clicks Mobility   Outcome Measure  Help needed turning from your back to your side while in a flat bed without using bedrails?: A Little Help needed moving from lying on your back to sitting on the side of a flat bed without using bedrails?: A Little Help needed moving to and from a bed to a chair (including a wheelchair)?: A Little Help needed standing up from a chair using your arms (e.g., wheelchair or bedside chair)?: A Little Help needed to walk in hospital room?: A Little Help needed climbing 3-5 steps with a  railing? : A Little 6 Click Score: 18    End of Session Equipment Utilized During Treatment: Back brace;Gait belt Activity Tolerance: Patient tolerated treatment well Patient left: with call bell/phone within reach;in chair;with family/visitor present   PT Visit Diagnosis: Unsteadiness on feet (R26.81);Other abnormalities of gait and mobility (R26.89);Muscle weakness (generalized) (M62.81);Difficulty in walking, not elsewhere classified (R26.2);Pain Pain - Right/Left:  (back) Pain - part of body:  (back)     Time: 9195-9145 PT Time Calculation (min) (ACUTE ONLY): 50 min  Charges:    $Gait Training: 23-37 mins $Therapeutic Activity: 8-22 mins PT General Charges $$ ACUTE PT VISIT: 1 Visit                     Theo Ferretti, PT, DPT Acute Rehabilitation Services  Office: [phone]    Theo CHRISTELLA Ferretti 05/27/2024, 10:52 AM

## 2024-05-28 ENCOUNTER — Inpatient Hospital Stay (HOSPITAL_COMMUNITY): Payer: Self-pay

## 2024-05-28 MED ORDER — METHOCARBAMOL 500 MG PO TABS: 1000.0000 mg | ORAL_TABLET | Freq: Three times a day (TID) | ORAL | 0 refills | Status: AC | PRN

## 2024-05-28 MED ORDER — ACETAMINOPHEN 500 MG PO TABS: 1000.0000 mg | ORAL_TABLET | Freq: Four times a day (QID) | ORAL | Status: AC | PRN

## 2024-05-28 MED ORDER — LIDOCAINE 5 % EX PTCH: 1.0000 | MEDICATED_PATCH | CUTANEOUS | 0 refills | Status: AC

## 2024-05-28 MED ORDER — DOCUSATE SODIUM 100 MG PO CAPS: 100.0000 mg | ORAL_CAPSULE | Freq: Every day | ORAL | Status: AC | PRN

## 2024-05-28 MED ORDER — TRAMADOL HCL 50 MG PO TABS: 100.0000 mg | ORAL_TABLET | Freq: Four times a day (QID) | ORAL | 1 refills | Status: AC

## 2024-05-28 MED ORDER — POLYETHYLENE GLYCOL 3350 17 G PO PACK
17.0000 g | PACK | Freq: Every day | ORAL | Status: AC | PRN
Start: 2024-05-28 — End: ?

## 2024-05-28 NOTE — Progress Notes (Signed)
 Order to discharge patient home. Family at bedside. Discharge instructions/AVS given to and reviewed with patient. Education provided as needed. Patient verbalized understanding. PIV removed by Felicia NT3. DME cane to be delivered home. Personal belongings sent home with patient. Home via private vehicle.

## 2024-05-28 NOTE — TOC Transition Note (Signed)
 Transition of Care (TOC) - Discharge Note Rayfield Gobble RN,BSN Transitions of Care Unit 4NP (Non Trauma)- RN Case Manager See Treatment Team for direct Phone #   Patient Details  Name: Mike Miranda MRN: [id number] Date of Birth: 1981-08-08  Transition of Care [facility]) CM/SW Contact:  Gobble Rayfield Hurst, RN Phone Number: 05/28/2024, 11:12 AM   Clinical Narrative:    Pt stable for transition home today. WC CM- [facility] is working on discharge needs- HH and DME. CM spoke with Dorina this am- per [facility] has been arranged w/ [facility] and will do start of care on Monday 6/30. DME- Cane to be delivered to the home. Faxed requested clinicals (med list, labs)  Wife to transport home.   1100- received call from [facility] regarding [facility]- per Dorina wife is requesting outpt therapy and not HH due to them not wanting strangers in their home. Dorina will work on getting outpt PT/OT arranged.   No further TOC needs noted at this time, Cm will fax d/c summary when available to [facility] as per request.    Final next level of care: OP Rehab Barriers to Discharge: Barriers Resolved   Patient Goals and CMS Choice Patient states their goals for this hospitalization and ongoing recovery are:: return home CMS Medicare.gov Compare Post Acute Care list provided to:: Patient Choice offered to / list presented to : Patient, Spouse      Discharge Placement               Home        Discharge Plan and Services Additional resources added to the After Visit Summary for   In-house Referral: Clinical Social Work Discharge Planning Services: CM Consult Post Acute Care Choice: Durable Medical Equipment, Home Health (per workers comp)          DME Arranged: Air traffic controller spoke with at DME Agency: Per Workers Comp- arrangements HH Arranged: PT, OT       Representative spoke with at [facility] Agency: Per Workers Comp CM arrangements  Social Drivers of Health (SDOH)  Interventions SDOH Screenings   Tobacco Use: Low Risk  (05/13/2024)     Readmission Risk Interventions    05/14/2024   11:51 AM  Readmission Risk Prevention Plan  Post Dischage Appt Complete  Medication Screening Complete  Transportation Screening Complete

## 2024-05-28 NOTE — Discharge Summary (Signed)
 Physician Discharge Summary  Patient ID: Mike Miranda MRN: [id number] DOB/AGE: Apr 18, 1981 43 y.o.  Admit date: 05/13/2024 Discharge date: 05/28/2024  Discharge Diagnoses Pinned by ambulance Multiple left rib fractures with hemopneumothorax Grade II medial spleen injury with small area of active extravasation  Left adrenal hematoma Small right renal infarct T9-L1 spinous process fractures Multiple lumbar transverse process fractures Thoracic compression fractures ABL anemia, stable BLE edema, improving  Consultants IR Neurosurgery Cardiothoracic surgery  Procedures Splenic artery embolization - Dr. Cordella Banner 05/13/24  Central line placement - Dr. Dann Hummer 05/13/24  Chest tube insertion - Almarie Pringle, PA-C 05/14/24   HPI: Patient is a 43 year old male with a past medical history of seizure disorder, hypertension, anxiety who presented as a level 1 trauma.  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  Patient denied loss of consciousness. Per EMS heart rate 140s, systolic blood pressure 150 en route. 2 peripheral IVs were placed and he received 1 L of IV fluid and a bolus of fentanyl  en route to the hospital. On arrival GCS was 15. Patient complained of back pain, worse on his left side. Reported taking Depakote  for his seizure disorder, along with lisinopril and anxiety medicine daily.  Denied use of blood thinners. NKDA. Fast examination in the trauma bay was negative. Work up in the ED revealed above listed injuries. Patient was admitted to the trauma ICU.   Hospital Course: Interventional radiology consulted for splenic injury with extravasation and proceeded with selective splenic embolization as listed above. Neurosurgery consulted for multiple spine fractures and recommended TLSO brace with out of bed, no surgical  intervention. Patient developed worsening left hemothorax 6/13 and large bore chest tube was placed as listed above. TCTS consulted 6/14 for high output from left chest tube with persistent hemothorax on follow up CXR. Chest tube output slowed and hemothorax improved on CXR, TCTS  signed off 6/16 and no surgical intervention recommended. Chest tube was placed to water seal 6/16. Patient transferred out of ICU 6/15. Patient started developing some abdominal distention 6/17 and clinically was developing ileus, diet decreased and patient improved with conservative management. Diet advanced again as tolerated. Central line removed 6/19. Noted BLE edema 6/21, this improved with lasix  and TED hose. BLE dopplers were negative for DVT. Noted some right knee pain 6/22, plain film negative for acute fracture. CT remained on WS but output was too high to remove, follow up CXR 6/24 with slightly increased L PTX. L CT placed back to suction 6/24, follow up film 6/25 improved and put back to [facility]. Patient's L CT was removed 6/26 and follow up CXR was stable. Hospital course also complicated by ABL anemia which was monitored and hgb stabilized.   Patient was evaluated by therapies throughout admission who initially recommended CIR but patient progressed to home with home therapies. On 05/28/24 patient was stable for discharge with follow up as outlined below.   PE: Gen:  Alert, NAD, pleasant Card:  RRR Pulm:  CTAB, no W/R/R, effort normal. On RA. Left chest tube in place on WS. No air leak. Appears 20 cc serous output Abd: Soft, ND, mild left sided ttp.  Ext: TED hose to BLE Neuro: CN 3-12 grossly intact, MAE's, non-focal Psych: A&Ox3   Allergies as of 05/28/2024   No Known Allergies      Medication  List     TAKE these medications    acetaminophen  500 MG tablet Commonly known as: TYLENOL  Take 2 tablets (1,000 mg total) by mouth every 6 (six) hours as needed for mild pain (pain score 1-3), headache or  fever. What changed:  when to take this reasons to take this   clonazePAM  0.5 MG tablet Commonly known as: KLONOPIN  Take 0.5 mg by mouth daily as needed for anxiety.   diphenhydrAMINE 25 MG tablet Commonly known as: BENADRYL Take 25 mg by mouth daily as needed for allergies or sleep.   divalproex  500 MG DR tablet Commonly known as: DEPAKOTE  Take 1-2 tablets by mouth 2 (two) times daily. 500 mg in the morning, 1000 mg in the evening   docusate sodium  100 MG capsule Commonly known as: COLACE Take 1 capsule (100 mg total) by mouth daily as needed for mild constipation.   fluticasone 50 MCG/ACT nasal spray Commonly known as: FLONASE Place 1 spray into both nostrils 2 (two) times daily.   lidocaine  5 % Commonly known as: LIDODERM  Place 1 patch onto the skin daily. Remove & Discard patch within 12 hours or as directed by MD   lisinopril 20 MG tablet Commonly known as: ZESTRIL Take 20 mg by mouth daily.   methocarbamol  500 MG tablet Commonly known as: ROBAXIN  Take 2 tablets (1,000 mg total) by mouth every 8 (eight) hours as needed for muscle spasms.   polyethylene glycol 17 g packet Commonly known as: MIRALAX  / GLYCOLAX  Take 17 g by mouth daily as needed for mild constipation (constipation).   traMADol  50 MG tablet Commonly known as: ULTRAM  Take 2 tablets (100 mg total) by mouth every 6 (six) hours.               Durable Medical Equipment  (From admission, onward)           Start     Ordered   05/27/24 1324  For home use only DME Cane  Once        05/27/24 1323              Follow-up Information     [facility] Go on 06/07/2024.   Why: For follow up chest x-ray, trauma PA will call you with results of this study Contact information: [address] [place] [address] [phone]         [facility]. Call.   Why: As needed with questions regarding recent hospital stay Contact information: [address] [place]  [address] [phone]        Mavis Purchase, MD. Schedule an appointment as soon as possible for a visit in 2 week(s).   Specialty: Neurosurgery Why: To follow up regarding back fractures managed in brace Contact information: [address] [place] [address] [phone]         Silver Lamar LABOR, MD. Call.   Specialty: Family Medicine Why: As needed Contact information: [address] [place] [address] [phone]                 Signed: Burnard JONELLE Louder , [facility] 05/28/2024, 10:32 AM Please see Amion for pager number during day hours 7:00am-4:30pm

## 2024-05-31 ENCOUNTER — Encounter: Payer: Self-pay | Admitting: Gastroenterology

## 2024-05-31 NOTE — TOC CM/SW Note (Signed)
 6/30- Call from patient's wife with questions for FMLA paperwork - asked PA.

## 2024-06-07 ENCOUNTER — Ambulatory Visit
Admission: RE | Admit: 2024-06-07 | Discharge: 2024-06-07 | Disposition: A | Discharge status: Home / Self Care | Payer: Worker's Compensation | Source: Ambulatory Visit | Attending: Physician Assistant

## 2024-06-07 DIAGNOSIS — J939 Pneumothorax, unspecified: Secondary | ICD-10-CM

## 2024-07-23 NOTE — Progress Notes (Signed)
 PROVIDER:  DANN DALLAS HUMMER, MD  MRN: [id number] DOB: 1981-11-29 DATE OF ENCOUNTER: 07/23/2024 Subjective     Chief Complaint: Post Operative Visit (follow up spleen lac/)     History of Present Illness: Mike Miranda is a 43 y.o. male who is seen today for F/U after trauma admission..   Admit date: 05/13/2024 Discharge date: 05/28/2024   Discharge Diagnoses Pinned by ambulance Multiple left rib fractures with hemopneumothorax Grade II medial spleen injury with small area of active extravasation  Left adrenal hematoma Small right renal infarct T9-L1 spinous process fractures Multiple lumbar transverse process fractures Thoracic compression fractures ABL anemia, stable   He underwent chest tube placement and splenic artery embolization on admission.   Since D/C, he is gradually improving.  He is seeing Dr. Selinda Gosling regarding ligamentous injuries in his right knee.  He continues to have a lot of pain at the displaced rib fracture left posterior.  His abdominal wall hematoma has improved.  He is still having a little bit of neuropathic pain from that.  He reports he had a follow-up abdominal scan which looked good.  Review of Systems: A complete review of systems was obtained from the patient.  I have reviewed this information and discussed as appropriate with the patient.  See HPI as well for other ROS.  ROS    Medical History: Past Medical History:  Diagnosis Date  . Anxiety   . GERD (gastroesophageal reflux disease)   . Hypertension   . Seizures (CMS/HHS-HCC)     There is no problem list on file for this patient.   Past Surgical History:  Procedure Laterality Date  . Shoulder Surgery    . spleen lac       Allergies  Allergen Reactions  . Topiramate Other (See Comments)    irritability  topiramate  . Zonisamide Other (See Comments)    irritability  zonisamide    Current Outpatient Medications on File Prior to Visit  Medication Sig Dispense  Refill  . divalproex  (DEPAKOTE  ER) 500 MG ER tablet take 3 tablets (1,500 mg total) by mouth daily    . HYDROcodone-acetaminophen  (NORCO) 5-325 mg tablet TAKE 1/2 TO 1 TABLET NO MORE THAN TWICE DAILY ONLY AS NEEDED FOR HEALING POLYTRAUMA PAIN    . lisinopriL (ZESTRIL) 20 MG tablet     . cyclobenzaprine (FLEXERIL) 10 MG tablet Take by mouth every 8 (eight) hours (Patient not taking: Reported on 07/23/2024)     No current facility-administered medications on file prior to visit.    History reviewed. No pertinent family history.   Social History   Tobacco Use  Smoking Status Never  Smokeless Tobacco Never     Social History   Socioeconomic History  . Marital status: Married  Tobacco Use  . Smoking status: Never  . Smokeless tobacco: Never  Vaping Use  . Vaping status: Never Used  Substance and Sexual Activity  . Alcohol use: Never  . Drug use: Never   Social Drivers of Health   Food Insecurity: Low Risk  (09/08/2023)   Received from [facility]   Hunger Vital Sign   . Within the past 12 months, you worried that your food would run out before you got money to buy more: Never true   . Within the past 12 months, the food you bought just didn't last and you didn't have money to get more. : Never true  Transportation Needs: No Transportation Needs (09/08/2023)   Received from [facility]  Transportation   . In the past 12 months, has lack of reliable transportation kept you from medical appointments, meetings, work or from getting things needed for daily living? : No  Housing Stability: Unknown (07/02/2024)   Housing Stability Vital Sign   . Homeless in the Last Year: No    Objective:    Vitals:   07/23/24 1358  BP: 129/85  Pulse: 100  Resp: 18  Temp: 36.9 C (98.5 F)  SpO2: 98%  Weight: (!) 111.8 kg (246 lb 6.4 oz)  Height: 175.3 cm (5' 9)  PainSc: 10-Worst pain ever  PainLoc: Knee    Body mass index is 36.39 kg/m.  Physical Exam Cardiovascular:     Rate  and Rhythm: Normal rate and regular rhythm.  Pulmonary:     Effort: Pulmonary effort is normal.     Breath sounds: Normal breath sounds. No wheezing.     Comments: Some tenderness over left posterior displaced rib fracture which seems to be healing more.  It is not as mobile with some significant improvement since his last visit Abdominal:     General: Abdomen is flat.     Palpations: Abdomen is soft.     Tenderness: There is no abdominal tenderness. There is no guarding or rebound.  Musculoskeletal:     Comments: Brace on right knee  Neurological:     Mental Status: He is alert.       Labs, Imaging and Diagnostic Testing:  /   Assessment and Plan:     Diagnoses and all orders for this visit:  Laceration of spleen, subsequent encounter  Closed fracture of multiple ribs of left side with delayed healing, subsequent encounter  Abdominal wall hematoma, sequela  Pneumothorax, left     From my standpoint he continues to improve.  I would like to see him back in about 3 weeks.  He is going to see Dr. Shyrl regarding his delayed healing displaced rib fracture.  He is going to have surgery with Dr. Sharl for his right knee.  I told him it is okay for him to drive.  I will see him back in a few weeks.  No follow-ups on file.   DANN DALLAS HUMMER, MD

## 2024-07-26 NOTE — Progress Notes (Signed)
 SUBJECTIVE   Patient ID: Mike Miranda is a 43 y.o. (DOB August 16, 1981) male with a  past medical history of GERD, hypertension, migraine headaches, generalized seizure disorder, hyperlipidemia, seasonal allergies who presents for evaluation of right ear fullness.   Chief Complaint  Patient presents with  . Ear Fullness        Ear Pain: Patient presents with left ear pain.  Symptoms include fullness in the left ear. Symptoms began several days ago and are unchanged since that time. Patient denies fever and sore throat.   Results for orders placed or performed in visit on 02/10/24  Valproic Acid   Collection Time: 02/10/24  8:39 AM  Result Value Ref Range   Valproic Acid 60 50 - 100 ug/mL  Liver Function Panel   Collection Time: 02/10/24  8:39 AM  Result Value Ref Range   Albumin  4.3 3.5 - 5.7 g/dL   Bilirubin, Total 0.4 0.3 - 1.0 mg/dL   Bilirubin, Direct 0.1 0.0 - 0.2 mg/dL   Alkaline Phosphatase (ALP) 39 34 - 104 U/L   Aspartate Aminotransferase (AST) 18 13 - 39 U/L   Alanine Aminotransferase (ALT) 20 7 - 52 U/L   Total Protein 6.5 6.4 - 8.9 g/dL    OBJECTIVE   Joozmhpzd[8]  Current Medications[2]  Problem List[3]  Medical History[4]    Surgical History[5]  Family History[6]  BP 124/78 (BP Location: Left arm, Patient Position: Sitting)   Pulse 102   Ht 1.753 m (5' 9)   Wt 112 kg (246 lb)   SpO2 99%   BMI 36.33 kg/m   Most recent PHQ-2 results:   Most recent PHQ-9 result:   PHQ-9 Question # 9 Thoughts that you would be better off dead or hurting yourself in some way: Not at all (07/26/2024  1:59 PM)  Interpretation:     Depression Plan: Normal/Negative Screening   Health Maintenance  Topic Date Due  . Comprehensive Annual Visit  Never done  . Varicella Vaccines (1 of 2 - 13+ 2-dose series) Never done  . HIV Screening  Never done  . Hepatitis C Screening  Never done  . Hepatitis B Vaccines (1 of 3 - 19+ 3-dose series) Never done  . HPV Vaccines  (1 - 3-dose SCDM series) Never done  . DTaP/Tdap/Td Vaccines (2 - Td or Tdap) 09/07/2022  . COVID-19 Vaccine (1 - 2024-25 season) Never done  . Diabetes Screening  06/10/2024  . Influenza Vaccine (1) 07/02/2024  . Depression Screening  02/09/2025  . Adult RSV (60+ Years or Pregnancy) (1 - 1-dose 75+ series) 01/10/2055  . Pneumococcal Vaccine: Pediatrics (0 to 5 years) and At-Risk Patients (6-49 Years)  Aged Out  . HIB Vaccines  Aged Out  . IPV Vaccines  Aged Out  . Hepatitis A Vaccines  Aged Out  . Meningococcal Conjugate (ACWY) Vaccine  Aged Out  . Rotavirus Vaccines  Aged Out  . Meningococcal B Vaccine  Aged Out    Review of Systems A complete ROS was performed with positive/negative pertinent findings listed in the HPI. All other systems are negative.  Review of Systems  Constitutional: Negative.   HENT:  Positive for congestion.   Eyes: Negative.   Respiratory: Negative.    Cardiovascular: Negative.   Gastrointestinal: Negative.   Endocrine: Negative.   Genitourinary: Negative.   Musculoskeletal: Negative.   Skin: Negative.   Allergic/Immunologic: Negative.   Neurological: Negative.   Hematological: Negative.   Psychiatric/Behavioral: Negative.      Constitutional:  NAD.  Appears well-developed, well groomed and well nourished. HEENT: Tympanic membranes are slightly retracted bilaterally.  Nasal passages with minimal congestion, oropharynx is clear. Neck: No cervical adenopathy.  Cardiovascular: Normal rate and rhythm.  Pulmonary/chest:  Lungs clear to auscultation Skin: Warm and dry. No rashes noted.  Neuro:  Alert and oriented x 3, CN grossly intact.  Psychiatric: Normal mood and affect. Behavior normal. Judgement and thought content normal.    ASSESSMENT/PLAN    1. Eustachian tube dysfunction, bilateral (Primary)  - predniSONE (DELTASONE) 20 mg tablet; Take 1 tablet (20 mg total) by mouth 2 (two) times a day for 5 days.  Dispense: 10 tablet; Refill: 0  2.  Acute swimmer's ear of both sides  - ciprofloxacin-dexAMETHasone (CIPRODEX) 0.3-0.1 % otic suspension; Administer 4 drops into the right ear 2 (two) times a day for 10 days.  Dispense: 7.5 mL; Refill: 0  -Follow-up if symptoms do not progressively improve or worsen.  Risks, benefits, and alternatives of the medication(s) and treatment plan(s) were discussed, and he expressed understanding. Plan follow up as discussed or as needed. No barriers to treatment identified in this visit.     This document serves as a record of services personally performed by Lamar Simpers, MD.  It was created on their behalf by Leita Sharl Kilts, CMA, a trained medical scribe, and Certified Medical Assistant (CMA). During the course of documenting the history, physical exam and medical decision making, I was functioning as a Stage manager. The creation of this record is the provider's dictation and/or activities during the visit.  Electronically signed by Leita Sharl Souther, CMA 07/26/2024 2:02 PM  I agree the documentation is accurate and complete.  Electronically signed by: Lamar DELENA Simpers, MD 07/26/2024 2:17 PM          [1] Allergies Allergen Reactions  . Topiramate Other (See Comments)    irritability  . Zonisamide Other (See Comments)    irritability  [2] Current Outpatient Medications  Medication Sig Dispense Refill  . clonazePAM  (KlonoPIN ) 0.5 mg tablet TAKE 1 TABLET BY MOUTH TWICE A DAY 60 tablet 1  . cyclobenzaprine (FLEXERIL) 10 mg tablet Take by mouth every 8 (eight) hours.    . divalproex  (DEPAKOTE ) 500 mg extended release tablet TAKE 3 TABLETS (1,500 MG TOTAL) BY MOUTH DAILY 270 tablet 1  . fluticasone propionate (FLONASE) 50 mcg/spray nasal spray ADMINISTER 1 SPRAY INTO EACH NOSTRIL 2 TIMES A DAY. 32 mL 3  . HYDROcodone-acetaminophen  (NORCO) 5-325 mg per tablet     . lisinopriL (PRINIVIL) 20 mg tablet TAKE 1 TABLET BY MOUTH EVERY DAY 90 tablet 3  . SUMAtriptan (IMITREX) 50 mg  tablet Take 50 mg by mouth as needed for migraine. 12 tablet 3  . ciprofloxacin-dexAMETHasone (CIPRODEX) 0.3-0.1 % otic suspension Administer 4 drops into the right ear 2 (two) times a day for 10 days. 7.5 mL 0  . predniSONE (DELTASONE) 20 mg tablet Take 1 tablet (20 mg total) by mouth 2 (two) times a day for 5 days. 10 tablet 0   Current Facility-Administered Medications  Medication Dose Route Frequency Provider Last Rate Last Admin  . triamcinolone acetonide (KENALOG-40) 40 mg/mL injection 80 mg  80 mg intramuscular Once Lamar DELENA Simpers, MD      [3] Patient Active Problem List Diagnosis  . Essential hypertension  . Mixed dyslipidemia  . Environmental and seasonal allergies  . Hx of oral aphthous ulcers  . Panic anxiety syndrome  . GERD with esophagitis  . Plantar fasciitis  of left foot  . Carpal tunnel syndrome on both sides  . Chronic left shoulder pain  . Generalized seizure disorder (HCC)  . Migraine without aura and without status migrainosus, not intractable  [4] Past Medical History: Diagnosis Date  . Adjustment disorder   . GERD (gastroesophageal reflux disease)   . History of migraine   . Hypercholesterolemia   . Hypertension   . Seizures    (CMD)   [5] Past Surgical History: Procedure Laterality Date  . OTHER SURGICAL HISTORY     Procedure: OTHER SURGICAL HISTORY (Left rotator cuff repair)  [6] Family History Problem Relation Name Age of Onset  . Migraines Father    . Migraines Paternal Aunt    . Seizures Maternal Grandmother    . Diabetes Maternal Grandmother    . Hypertension Maternal Grandmother    . Parkinsonism Paternal Grandfather    . Diabetes Paternal Grandfather    . Hypertension Paternal Grandfather    . Stroke Neg Hx    . Neuropathy Neg Hx    . Multiple sclerosis Neg Hx    . Depression Neg Hx    . Dementia Neg Hx

## 2024-08-05 ENCOUNTER — Other Ambulatory Visit: Payer: Self-pay

## 2024-08-05 DIAGNOSIS — S2249XA Multiple fractures of ribs, unspecified side, initial encounter for closed fracture: Secondary | ICD-10-CM

## 2024-08-09 NOTE — Telephone Encounter (Signed)
 LOV: 02/10/24 POV: 08/12/24  1 RX(s) pended

## 2024-08-24 ENCOUNTER — Ambulatory Visit (INDEPENDENT_AMBULATORY_CARE_PROVIDER_SITE_OTHER): Payer: Self-pay | Admitting: Thoracic Surgery (Cardiothoracic Vascular Surgery)

## 2024-08-24 ENCOUNTER — Encounter: Payer: Self-pay | Admitting: Thoracic Surgery (Cardiothoracic Vascular Surgery)

## 2024-08-24 ENCOUNTER — Ambulatory Visit
Admission: RE | Admit: 2024-08-24 | Discharge: 2024-08-24 | Disposition: A | Discharge status: Home / Self Care | Payer: Worker's Compensation | Source: Ambulatory Visit | Attending: Internal Medicine | Admitting: Internal Medicine

## 2024-08-24 VITALS — BP 129/85 | HR 86 | Resp 20 | Ht 69.0 in | Wt 255.6 lb

## 2024-08-24 DIAGNOSIS — S2249XA Multiple fractures of ribs, unspecified side, initial encounter for closed fracture: Secondary | ICD-10-CM

## 2024-08-24 DIAGNOSIS — S2239XA Fracture of one rib, unspecified side, initial encounter for closed fracture: Secondary | ICD-10-CM | POA: Insufficient documentation
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
